# Patient Record
Sex: Female | Born: 1985 | Race: White | Hispanic: No | Marital: Married | Smoking: Never smoker
Health system: Southern US, Community
[De-identification: ages and names within clinical notes are randomized; demographics above are authoritative.]

## PROBLEM LIST (undated history)

## (undated) DIAGNOSIS — Z789 Other specified health status: Secondary | ICD-10-CM

## (undated) HISTORY — PX: WISDOM TOOTH EXTRACTION: SHX21

## (undated) HISTORY — PX: TONSILLECTOMY: SUR1361

---
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
## 2012-12-12 LAB — OB RESULTS CONSOLE RPR: RPR: NONREACTIVE

## 2012-12-12 LAB — OB RESULTS CONSOLE HIV ANTIBODY (ROUTINE TESTING): HIV: NONREACTIVE

## 2012-12-20 LAB — OB RESULTS CONSOLE RPR: RPR: NONREACTIVE

## 2012-12-20 LAB — OB RESULTS CONSOLE HEPATITIS B SURFACE ANTIGEN: Hepatitis B Surface Ag: NEGATIVE

## 2013-01-10 LAB — OB RESULTS CONSOLE RUBELLA ANTIBODY, IGM: Rubella: IMMUNE

## 2013-01-10 LAB — OB RESULTS CONSOLE ABO/RH: RH Type: POSITIVE

## 2013-07-09 ENCOUNTER — Telehealth (HOSPITAL_COMMUNITY): Payer: Self-pay | Admitting: *Deleted

## 2013-07-09 NOTE — Telephone Encounter (Signed)
Preadmission screen  

## 2013-07-11 ENCOUNTER — Inpatient Hospital Stay (HOSPITAL_COMMUNITY)
Admission: AD | Admit: 2013-07-11 | Discharge: 2013-07-11 | Disposition: A | Discharge status: Home / Self Care | Payer: 59 | Source: Ambulatory Visit | Attending: Obstetrics and Gynecology | Admitting: Obstetrics and Gynecology

## 2013-07-11 ENCOUNTER — Encounter (HOSPITAL_COMMUNITY): Payer: Self-pay | Admitting: *Deleted

## 2013-07-11 DIAGNOSIS — O480 Post-term pregnancy: Secondary | ICD-10-CM | POA: Insufficient documentation

## 2013-07-11 HISTORY — DX: Other specified health status: Z78.9

## 2013-07-11 NOTE — MAU Provider Note (Signed)
History   27 yo G1P0 at 41 weeks presented for NST for post-dates.  Reports +FM, denies leaking or bleeding.  Scheduled for induction 07/18/13 at 7am.  Per chart, cervix has been 2 cm, 70%, vtx 0 station at visit last week, with membranes swept by Dr. Estanislado Pandy.  Does not have appt scheduled at office for this week.  Chief Complaint  Patient presents with  . Non-stress Test   HPI:  See above  OB History   Grav Para Term Preterm Abortions TAB SAB Ect Mult Living   1               Past Medical History  Diagnosis Date  . Medical history non-contributory     Past Surgical History  Procedure Laterality Date  . Wisdom tooth extraction      Family History  Problem Relation Age of Onset  . Diabetes Maternal Grandfather   . Diabetes Paternal Grandfather     History  Substance Use Topics  . Smoking status: Never Smoker   . Smokeless tobacco: Not on file  . Alcohol Use: No    Allergies: No Known Allergies  No prescriptions prior to admission    ROS:  +FM. Physical Exam   Blood pressure 124/79, pulse 94, temperature 97.5 F (36.4 C), temperature source Oral, resp. rate 18, height 5\' 7"  (1.702 m), weight 162 lb (73.483 kg).  Physical Exam Cervix very posterior, vtx -1/0 station, just able to reach anterior lip of cervix. Will assume still 2 cm, agree with 70%.  ED Course  IUP at 41 weeks NST only visit, due to office closed today.  Plan: Office will call her Monday to schedule ROB visit early this week (Mon/Tues), with NST. Keep plan for induction on Friday, 07/18/13, at 7am. Reviewed FKCs and s/s of labor.    Nigel Bridgeman CNM, [place] 07/11/2013 1:20 PM

## 2013-07-11 NOTE — MAU Note (Signed)
Pt is here for outpatient NST

## 2013-07-14 ENCOUNTER — Inpatient Hospital Stay (HOSPITAL_COMMUNITY): Payer: 59

## 2013-07-15 ENCOUNTER — Inpatient Hospital Stay (HOSPITAL_COMMUNITY)
Admission: AD | Admit: 2013-07-15 | Discharge: 2013-07-18 | DRG: 765 | Disposition: A | Discharge status: Home / Self Care | Payer: 59 | Source: Ambulatory Visit | Attending: Obstetrics and Gynecology | Admitting: Obstetrics and Gynecology

## 2013-07-15 ENCOUNTER — Encounter (HOSPITAL_COMMUNITY): Payer: Self-pay | Admitting: *Deleted

## 2013-07-15 DIAGNOSIS — O209 Hemorrhage in early pregnancy, unspecified: Secondary | ICD-10-CM | POA: Diagnosis not present

## 2013-07-15 DIAGNOSIS — O411090 Infection of amniotic sac and membranes, unspecified, unspecified trimester, not applicable or unspecified: Secondary | ICD-10-CM | POA: Diagnosis present

## 2013-07-15 DIAGNOSIS — O9903 Anemia complicating the puerperium: Secondary | ICD-10-CM | POA: Diagnosis not present

## 2013-07-15 DIAGNOSIS — Z98891 History of uterine scar from previous surgery: Secondary | ICD-10-CM

## 2013-07-15 DIAGNOSIS — IMO0001 Reserved for inherently not codable concepts without codable children: Secondary | ICD-10-CM

## 2013-07-15 DIAGNOSIS — O480 Post-term pregnancy: Secondary | ICD-10-CM | POA: Diagnosis present

## 2013-07-15 DIAGNOSIS — O324XX0 Maternal care for high head at term, not applicable or unspecified: Secondary | ICD-10-CM | POA: Diagnosis present

## 2013-07-15 DIAGNOSIS — IMO0002 Reserved for concepts with insufficient information to code with codable children: Secondary | ICD-10-CM | POA: Diagnosis not present

## 2013-07-15 DIAGNOSIS — D62 Acute posthemorrhagic anemia: Secondary | ICD-10-CM | POA: Diagnosis not present

## 2013-07-15 DIAGNOSIS — D649 Anemia, unspecified: Secondary | ICD-10-CM | POA: Diagnosis not present

## 2013-07-15 LAB — CBC
HCT: 34.8 % — ABNORMAL LOW (ref 36.0–46.0)
Hemoglobin: 12.1 g/dL (ref 12.0–15.0)
MCH: 32.1 pg (ref 26.0–34.0)
MCHC: 34.8 g/dL (ref 30.0–36.0)
MCV: 92.3 fL (ref 78.0–100.0)
RBC: 3.77 MIL/uL — ABNORMAL LOW (ref 3.87–5.11)

## 2013-07-15 MED ORDER — IBUPROFEN 600 MG PO TABS: 600.0000 mg | ORAL_TABLET | Freq: Four times a day (QID) | ORAL | Status: DC | PRN

## 2013-07-15 MED ORDER — ONDANSETRON HCL 4 MG/2ML IJ SOLN: 4.0000 mg | Freq: Four times a day (QID) | INTRAMUSCULAR | Status: DC | PRN

## 2013-07-15 MED ORDER — LACTATED RINGERS IV SOLN
INTRAVENOUS | Status: DC
  Administered 2013-07-16 (×4): via INTRAVENOUS

## 2013-07-15 MED ORDER — CITRIC ACID-SODIUM CITRATE 334-500 MG/5ML PO SOLN
30.0000 mL | ORAL | Status: DC | PRN
  Administered 2013-07-16: 30 mL via ORAL
  Filled 2013-07-15: qty 15

## 2013-07-15 MED ORDER — FLEET ENEMA 7-19 GM/118ML RE ENEM: 1.0000 | ENEMA | RECTAL | Status: DC | PRN

## 2013-07-15 MED ORDER — ACETAMINOPHEN 325 MG PO TABS
650.0000 mg | ORAL_TABLET | ORAL | Status: DC | PRN
  Administered 2013-07-16 (×3): 650 mg via ORAL
  Filled 2013-07-15 (×3): qty 2

## 2013-07-15 MED ORDER — BUTORPHANOL TARTRATE 1 MG/ML IJ SOLN: 1.0000 mg | INTRAMUSCULAR | Status: DC | PRN

## 2013-07-15 MED ORDER — OXYTOCIN 40 UNITS IN LACTATED RINGERS INFUSION - SIMPLE MED
62.5000 mL/h | INTRAVENOUS | Status: DC
  Filled 2013-07-15: qty 1000

## 2013-07-15 MED ORDER — OXYCODONE-ACETAMINOPHEN 5-325 MG PO TABS: 1.0000 | ORAL_TABLET | ORAL | Status: DC | PRN

## 2013-07-15 MED ORDER — LIDOCAINE HCL (PF) 1 % IJ SOLN
30.0000 mL | INTRAMUSCULAR | Status: DC | PRN
  Filled 2013-07-15: qty 30

## 2013-07-15 MED ORDER — LACTATED RINGERS IV SOLN
500.0000 mL | INTRAVENOUS | Status: DC | PRN
  Administered 2013-07-15: 500 mL via INTRAVENOUS
  Administered 2013-07-16: 200 mL via INTRAVENOUS
  Administered 2013-07-16: 500 mL via INTRAVENOUS

## 2013-07-15 MED ORDER — OXYTOCIN BOLUS FROM INFUSION: 500.0000 mL | INTRAVENOUS | Status: DC

## 2013-07-15 NOTE — MAU Note (Signed)
Labor

## 2013-07-15 NOTE — H&P (Signed)
Shelley Sawyer is a 27 y.o. female, 25 4/7 weeks, presenting for with UCs of increasing frequency and intensity since office visit today, with membranes swept.  NST was non-reactive at office, with BPP 8/8 and normal AFI.  Denies leaking, bleeding, or leaking of fluid--reports +FM.  Patient Active Problem List   Diagnosis Date Noted  . Active labor 07/15/2013  . 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 First trimester bleeding 07/15/2013    History of present pregnancy: Patient entered care at 15 3/7 weeks at [facility], in transfer from [place], [place] prenatal care.  Began care there at 6 5/7 weeks, with early pregnancy bleeding--US at that visit, with confirmation of EDC. EDC of 07/04/13 was established by LMP and congruent with 6 5/7 week [place].   Anatomy scan:  19  3/7 weeks, with normal findings and an anterior placenta, female. Additional [place] evaluations:  08/04/13--BPP 8/8, AFI 13.7.   Significant prenatal events: TDaP and flu vaccine 06/10/13.  Frequent nosebleeds, but largely resolved in last few weeks.  Had spotting at 33 6/7 weeks s/p IC.  Membranes swept 11/18, 11/25, and today.   Last evaluation:  112/2/14 by Dr. Estanislado Pandy, with NR NST, BPP 8/8 and normal fluid.  Cervix 3, 80%, vtx, 0 station.  History OB History   Grav Para Term Preterm Abortions TAB SAB Ect Mult Living   1              Past Medical History  Diagnosis Date  . Medical history non-contributory    Past Surgical History  Procedure Laterality Date  . Wisdom tooth extraction     Family History: family history includes Cancer in her maternal grandfather; Diabetes in her maternal grandfather and paternal grandfather.  Social History:  reports that she has never smoked. She has never used smokeless tobacco. She reports that she does not drink alcohol or use illicit drugs.  Husband, Franky Macho, is involved and supportive.  Patient is employed in client services.   Prenatal Transfer Tool  Maternal Diabetes: No Genetic  Screening: Normal Quad screen Maternal Ultrasounds/Referrals: Normal Fetal Ultrasounds or other Referrals:  None Maternal Substance Abuse:  No Significant Maternal Medications:  None Significant Maternal Lab Results:  Lab values include: Group B Strep negative Other Comments:  No HIV testing during pregnancy--Rapid HIV drawn on admission.  ROS:  Contractions, + FM  Dilation: 3 Effacement (%): 90 Station: 0 Exam by:: Weston,RN Blood pressure 129/79, pulse 90, temperature 98.1 F (36.7 C), temperature source Oral, resp. rate 18, height 5\' 7"  (1.702 m), weight 164 lb (74.39 kg), last menstrual period 09/27/2012, SpO2 100.00%.  Exam Physical Exam  Chest clear Heart RRR without murmur Abd gravid, NT Pelvic--deferred after initial exam in MAU. Ext WNL  FHR Initially Category 2, with minimal variability, but stable baseline and negative spontaneous CST.  Now with moderate variability, Category 1. UCs q 3-5 min, mild/moderate  Prenatal labs: ABO, Rh: O/Positive/-- (05/30 0000) Antibody:  Neg Rubella: Immune (05/30 0000) RPR: Nonreactive (05/09 0000)  HBsAg: Negative (05/09 0000)  HIV: No result documented in previous PN records, not done during pregnancy care at [facility] GBS: Negative (10/23 0000)  No cultures done during pregnancy--declined. Hgb 12.2 at [facility] in [place], 11.9 at 29 weeks Glucola WNL Quad screen WNL Pap WNL 09/2010.  Assessment/Plan: IUP at 41 4/7 weeks Early labor GBS negative Desires non-interventive labor as much as possible, but open to options for care.  Plan: Admit to Birthing Suite per consult with Dr. Su Hilt Routine [facility] orders  Plan IV hydration initially to address FHR variability, then may saline lock if status reassuring. Will plan continuous monitoring at present.   Nigel Bridgeman 07/15/2013, 10:05 PM

## 2013-07-16 ENCOUNTER — Inpatient Hospital Stay (HOSPITAL_COMMUNITY): Payer: 59 | Admitting: Anesthesiology

## 2013-07-16 ENCOUNTER — Encounter (HOSPITAL_COMMUNITY)
Admission: AD | Disposition: A | Discharge status: Home / Self Care | Payer: Self-pay | Source: Ambulatory Visit | Attending: Obstetrics and Gynecology

## 2013-07-16 ENCOUNTER — Encounter (HOSPITAL_COMMUNITY): Payer: Self-pay | Admitting: *Deleted

## 2013-07-16 ENCOUNTER — Encounter (HOSPITAL_COMMUNITY): Payer: 59 | Admitting: Anesthesiology

## 2013-07-16 DIAGNOSIS — Z98891 History of uterine scar from previous surgery: Secondary | ICD-10-CM

## 2013-07-16 LAB — ABO/RH: ABO/RH(D): O POS

## 2013-07-16 LAB — RPR: RPR Ser Ql: NONREACTIVE

## 2013-07-16 SURGERY — Surgical Case
Anesthesia: Epidural | Site: Abdomen

## 2013-07-16 MED ORDER — TETANUS-DIPHTH-ACELL PERTUSSIS 5-2.5-18.5 LF-MCG/0.5 IM SUSP: 0.5000 mL | Freq: Once | INTRAMUSCULAR | Status: DC

## 2013-07-16 MED ORDER — FERROUS SULFATE 325 (65 FE) MG PO TABS
325.0000 mg | ORAL_TABLET | Freq: Two times a day (BID) | ORAL | Status: DC
  Administered 2013-07-17 – 2013-07-18 (×3): 325 mg via ORAL
  Filled 2013-07-16 (×3): qty 1

## 2013-07-16 MED ORDER — IBUPROFEN 600 MG PO TABS: 600.0000 mg | ORAL_TABLET | Freq: Four times a day (QID) | ORAL | Status: DC | PRN

## 2013-07-16 MED ORDER — LIDOCAINE-EPINEPHRINE (PF) 2 %-1:200000 IJ SOLN
INTRAMUSCULAR | Status: AC
  Filled 2013-07-16: qty 20

## 2013-07-16 MED ORDER — OXYTOCIN 10 UNIT/ML IJ SOLN
INTRAMUSCULAR | Status: AC
  Filled 2013-07-16: qty 4

## 2013-07-16 MED ORDER — FENTANYL CITRATE 0.05 MG/ML IJ SOLN
INTRAMUSCULAR | Status: AC
  Filled 2013-07-16: qty 2

## 2013-07-16 MED ORDER — LACTATED RINGERS IV SOLN
INTRAVENOUS | Status: DC
  Administered 2013-07-16: via INTRAVENOUS

## 2013-07-16 MED ORDER — WITCH HAZEL-GLYCERIN EX PADS: 1.0000 "application " | MEDICATED_PAD | CUTANEOUS | Status: DC | PRN

## 2013-07-16 MED ORDER — SENNOSIDES-DOCUSATE SODIUM 8.6-50 MG PO TABS
2.0000 | ORAL_TABLET | ORAL | Status: DC
  Administered 2013-07-17 – 2013-07-18 (×2): 2 via ORAL
  Filled 2013-07-16 (×2): qty 2

## 2013-07-16 MED ORDER — FENTANYL CITRATE 0.05 MG/ML IJ SOLN: 25.0000 ug | INTRAMUSCULAR | Status: DC | PRN

## 2013-07-16 MED ORDER — SCOPOLAMINE 1 MG/3DAYS TD PT72
MEDICATED_PATCH | TRANSDERMAL | Status: AC
  Filled 2013-07-16: qty 1

## 2013-07-16 MED ORDER — OXYCODONE-ACETAMINOPHEN 5-325 MG PO TABS
1.0000 | ORAL_TABLET | ORAL | Status: DC | PRN
  Administered 2013-07-18: 1 via ORAL
  Filled 2013-07-16: qty 2
  Filled 2013-07-16: qty 1

## 2013-07-16 MED ORDER — NALOXONE HCL 0.4 MG/ML IJ SOLN: 0.4000 mg | INTRAMUSCULAR | Status: DC | PRN

## 2013-07-16 MED ORDER — MEASLES, MUMPS & RUBELLA VAC ~~LOC~~ INJ
0.5000 mL | INJECTION | Freq: Once | SUBCUTANEOUS | Status: DC
  Filled 2013-07-16: qty 0.5

## 2013-07-16 MED ORDER — LANOLIN HYDROUS EX OINT: 1.0000 "application " | TOPICAL_OINTMENT | CUTANEOUS | Status: DC | PRN

## 2013-07-16 MED ORDER — KETOROLAC TROMETHAMINE 30 MG/ML IJ SOLN
30.0000 mg | Freq: Four times a day (QID) | INTRAMUSCULAR | Status: AC | PRN
  Administered 2013-07-16: 30 mg via INTRAVENOUS

## 2013-07-16 MED ORDER — PHENYLEPHRINE 40 MCG/ML (10ML) SYRINGE FOR IV PUSH (FOR BLOOD PRESSURE SUPPORT): 80.0000 ug | PREFILLED_SYRINGE | INTRAVENOUS | Status: DC | PRN

## 2013-07-16 MED ORDER — DIPHENHYDRAMINE HCL 50 MG/ML IJ SOLN: 12.5000 mg | INTRAMUSCULAR | Status: DC | PRN

## 2013-07-16 MED ORDER — DIPHENHYDRAMINE HCL 25 MG PO CAPS: 25.0000 mg | ORAL_CAPSULE | Freq: Four times a day (QID) | ORAL | Status: DC | PRN

## 2013-07-16 MED ORDER — SODIUM BICARBONATE 8.4 % IV SOLN
INTRAVENOUS | Status: DC | PRN
  Administered 2013-07-16 (×4): 5 mL via EPIDURAL

## 2013-07-16 MED ORDER — DIPHENHYDRAMINE HCL 25 MG PO CAPS: 25.0000 mg | ORAL_CAPSULE | ORAL | Status: DC | PRN

## 2013-07-16 MED ORDER — EPHEDRINE 5 MG/ML INJ
10.0000 mg | INTRAVENOUS | Status: DC | PRN
  Filled 2013-07-16: qty 4

## 2013-07-16 MED ORDER — SODIUM CHLORIDE 0.9 % IJ SOLN: 3.0000 mL | INTRAMUSCULAR | Status: DC | PRN

## 2013-07-16 MED ORDER — EPHEDRINE 5 MG/ML INJ: 10.0000 mg | INTRAVENOUS | Status: DC | PRN

## 2013-07-16 MED ORDER — ACETAMINOPHEN 500 MG PO TABS
1000.0000 mg | ORAL_TABLET | Freq: Four times a day (QID) | ORAL | Status: AC
  Administered 2013-07-17 (×3): 1000 mg via ORAL
  Filled 2013-07-16 (×3): qty 2

## 2013-07-16 MED ORDER — OXYTOCIN 40 UNITS IN LACTATED RINGERS INFUSION - SIMPLE MED: 62.5000 mL/h | INTRAVENOUS | Status: AC

## 2013-07-16 MED ORDER — FENTANYL CITRATE 0.05 MG/ML IJ SOLN
INTRAMUSCULAR | Status: DC | PRN
  Administered 2013-07-16: 50 ug via INTRAVENOUS

## 2013-07-16 MED ORDER — MORPHINE SULFATE 0.5 MG/ML IJ SOLN
INTRAMUSCULAR | Status: AC
  Filled 2013-07-16: qty 10

## 2013-07-16 MED ORDER — PRENATAL MULTIVITAMIN CH
1.0000 | ORAL_TABLET | Freq: Every day | ORAL | Status: DC
  Administered 2013-07-17 – 2013-07-18 (×2): 1 via ORAL
  Filled 2013-07-16 (×2): qty 1

## 2013-07-16 MED ORDER — NALBUPHINE HCL 10 MG/ML IJ SOLN
5.0000 mg | INTRAMUSCULAR | Status: DC | PRN
  Filled 2013-07-16: qty 1

## 2013-07-16 MED ORDER — MEPERIDINE HCL 25 MG/ML IJ SOLN: 6.2500 mg | INTRAMUSCULAR | Status: DC | PRN

## 2013-07-16 MED ORDER — ONDANSETRON HCL 4 MG PO TABS: 4.0000 mg | ORAL_TABLET | ORAL | Status: DC | PRN

## 2013-07-16 MED ORDER — ZOLPIDEM TARTRATE 5 MG PO TABS: 5.0000 mg | ORAL_TABLET | Freq: Every evening | ORAL | Status: DC | PRN

## 2013-07-16 MED ORDER — OXYTOCIN 10 UNIT/ML IJ SOLN
40.0000 [IU] | INTRAVENOUS | Status: DC | PRN
  Administered 2013-07-16: 40 [IU] via INTRAVENOUS

## 2013-07-16 MED ORDER — SIMETHICONE 80 MG PO CHEW: 80.0000 mg | CHEWABLE_TABLET | ORAL | Status: DC | PRN

## 2013-07-16 MED ORDER — FLEET ENEMA 7-19 GM/118ML RE ENEM: 1.0000 | ENEMA | Freq: Every day | RECTAL | Status: DC | PRN

## 2013-07-16 MED ORDER — MORPHINE SULFATE (PF) 0.5 MG/ML IJ SOLN
INTRAMUSCULAR | Status: DC | PRN
  Administered 2013-07-16: 4 mg via EPIDURAL

## 2013-07-16 MED ORDER — MIDAZOLAM HCL 2 MG/2ML IJ SOLN: 0.5000 mg | Freq: Once | INTRAMUSCULAR | Status: DC | PRN

## 2013-07-16 MED ORDER — IBUPROFEN 600 MG PO TABS
600.0000 mg | ORAL_TABLET | Freq: Four times a day (QID) | ORAL | Status: DC
  Administered 2013-07-16 – 2013-07-18 (×7): 600 mg via ORAL
  Filled 2013-07-16 (×8): qty 1

## 2013-07-16 MED ORDER — SIMETHICONE 80 MG PO CHEW
80.0000 mg | CHEWABLE_TABLET | ORAL | Status: DC
  Administered 2013-07-17 – 2013-07-18 (×2): 80 mg via ORAL
  Filled 2013-07-16 (×2): qty 1

## 2013-07-16 MED ORDER — ONDANSETRON HCL 4 MG/2ML IJ SOLN: 4.0000 mg | INTRAMUSCULAR | Status: DC | PRN

## 2013-07-16 MED ORDER — SODIUM BICARBONATE 8.4 % IV SOLN
INTRAVENOUS | Status: AC
  Filled 2013-07-16: qty 50

## 2013-07-16 MED ORDER — KETOROLAC TROMETHAMINE 30 MG/ML IJ SOLN
INTRAMUSCULAR | Status: AC
  Administered 2013-07-16: 30 mg via INTRAVENOUS
  Filled 2013-07-16: qty 1

## 2013-07-16 MED ORDER — CYCLOBENZAPRINE HCL 5 MG PO TABS
5.0000 mg | ORAL_TABLET | Freq: Three times a day (TID) | ORAL | Status: DC | PRN
  Filled 2013-07-16: qty 1

## 2013-07-16 MED ORDER — ONDANSETRON HCL 4 MG/2ML IJ SOLN: 4.0000 mg | Freq: Three times a day (TID) | INTRAMUSCULAR | Status: DC | PRN

## 2013-07-16 MED ORDER — SODIUM CHLORIDE 0.9 % IV SOLN
3.0000 g | INTRAVENOUS | Status: DC | PRN
  Administered 2013-07-16: 3 g via INTRAVENOUS

## 2013-07-16 MED ORDER — PHENYLEPHRINE 40 MCG/ML (10ML) SYRINGE FOR IV PUSH (FOR BLOOD PRESSURE SUPPORT)
80.0000 ug | PREFILLED_SYRINGE | INTRAVENOUS | Status: DC | PRN
  Filled 2013-07-16: qty 10

## 2013-07-16 MED ORDER — METOCLOPRAMIDE HCL 5 MG/ML IJ SOLN: 10.0000 mg | Freq: Three times a day (TID) | INTRAMUSCULAR | Status: DC | PRN

## 2013-07-16 MED ORDER — SODIUM CHLORIDE 0.9 % IV SOLN
3.0000 g | Freq: Four times a day (QID) | INTRAVENOUS | Status: DC
  Administered 2013-07-16 – 2013-07-17 (×6): 3 g via INTRAVENOUS
  Filled 2013-07-16 (×11): qty 3

## 2013-07-16 MED ORDER — MENTHOL 3 MG MT LOZG: 1.0000 | LOZENGE | OROMUCOSAL | Status: DC | PRN

## 2013-07-16 MED ORDER — ONDANSETRON HCL 4 MG/2ML IJ SOLN
INTRAMUSCULAR | Status: AC
  Filled 2013-07-16: qty 4

## 2013-07-16 MED ORDER — NALBUPHINE HCL 10 MG/ML IJ SOLN
5.0000 mg | INTRAMUSCULAR | Status: DC | PRN
Start: 2013-07-16 — End: 2013-07-18
  Filled 2013-07-16: qty 1

## 2013-07-16 MED ORDER — FENTANYL 2.5 MCG/ML BUPIVACAINE 1/10 % EPIDURAL INFUSION (WH - ANES)
14.0000 mL/h | INTRAMUSCULAR | Status: DC | PRN
  Administered 2013-07-16: 14 mL/h via EPIDURAL
  Filled 2013-07-16: qty 125

## 2013-07-16 MED ORDER — LACTATED RINGERS IV SOLN
500.0000 mL | Freq: Once | INTRAVENOUS | Status: AC
  Administered 2013-07-16: 500 mL via INTRAVENOUS

## 2013-07-16 MED ORDER — NALOXONE HCL 1 MG/ML IJ SOLN
1.0000 ug/kg/h | INTRAVENOUS | Status: DC | PRN
  Filled 2013-07-16: qty 2

## 2013-07-16 MED ORDER — BISACODYL 10 MG RE SUPP: 10.0000 mg | Freq: Every day | RECTAL | Status: DC | PRN

## 2013-07-16 MED ORDER — SCOPOLAMINE 1 MG/3DAYS TD PT72
1.0000 | MEDICATED_PATCH | Freq: Once | TRANSDERMAL | Status: DC
  Administered 2013-07-16: 1.5 mg via TRANSDERMAL

## 2013-07-16 MED ORDER — DIBUCAINE 1 % RE OINT: 1.0000 "application " | TOPICAL_OINTMENT | RECTAL | Status: DC | PRN

## 2013-07-16 MED ORDER — SIMETHICONE 80 MG PO CHEW
80.0000 mg | CHEWABLE_TABLET | Freq: Three times a day (TID) | ORAL | Status: DC
  Administered 2013-07-16 – 2013-07-18 (×5): 80 mg via ORAL
  Filled 2013-07-16 (×5): qty 1

## 2013-07-16 MED ORDER — KETOROLAC TROMETHAMINE 30 MG/ML IJ SOLN: 30.0000 mg | Freq: Four times a day (QID) | INTRAMUSCULAR | Status: AC | PRN

## 2013-07-16 MED ORDER — PROMETHAZINE HCL 25 MG/ML IJ SOLN: 6.2500 mg | INTRAMUSCULAR | Status: DC | PRN

## 2013-07-16 MED ORDER — DIPHENHYDRAMINE HCL 50 MG/ML IJ SOLN: 25.0000 mg | INTRAMUSCULAR | Status: DC | PRN

## 2013-07-16 SURGICAL SUPPLY — 37 items
BENZOIN TINCTURE PRP APPL 2/3 (GAUZE/BANDAGES/DRESSINGS) ×2 IMPLANT
CLAMP CORD UMBIL (MISCELLANEOUS) ×2 IMPLANT
CLOTH BEACON ORANGE TIMEOUT ST (SAFETY) ×2 IMPLANT
CONTAINER PREFILL 10% NBF 15ML (MISCELLANEOUS) IMPLANT
DRAIN JACKSON PRT FLT 10 (DRAIN) IMPLANT
DRAPE LG THREE QUARTER DISP (DRAPES) ×2 IMPLANT
DRSG OPSITE POSTOP 4X10 (GAUZE/BANDAGES/DRESSINGS) ×2 IMPLANT
DURAPREP 26ML APPLICATOR (WOUND CARE) ×2 IMPLANT
ELECT REM PT RETURN 9FT ADLT (ELECTROSURGICAL) ×2
ELECTRODE REM PT RTRN 9FT ADLT (ELECTROSURGICAL) ×1 IMPLANT
EVACUATOR SILICONE 100CC (DRAIN) IMPLANT
EXTRACTOR VACUUM M CUP 4 TUBE (SUCTIONS) IMPLANT
GLOVE BIO SURGEON STRL SZ 6.5 (GLOVE) ×2 IMPLANT
GLOVE BIOGEL PI IND STRL 7.0 (GLOVE) ×1 IMPLANT
GLOVE BIOGEL PI INDICATOR 7.0 (GLOVE) ×1
GOWN PREVENTION PLUS XLARGE (GOWN DISPOSABLE) ×2 IMPLANT
GOWN STRL REIN XL XLG (GOWN DISPOSABLE) ×2 IMPLANT
HEMOSTAT SURGICEL 4X8 (HEMOSTASIS) ×2 IMPLANT
KIT ABG SYR 3ML LUER SLIP (SYRINGE) ×4 IMPLANT
NEEDLE HYPO 25X5/8 SAFETYGLIDE (NEEDLE) ×4 IMPLANT
NS IRRIG 1000ML POUR BTL (IV SOLUTION) ×2 IMPLANT
PACK C SECTION WH (CUSTOM PROCEDURE TRAY) ×2 IMPLANT
PAD OB MATERNITY 4.3X12.25 (PERSONAL CARE ITEMS) ×2 IMPLANT
RTRCTR C-SECT PINK 25CM LRG (MISCELLANEOUS) IMPLANT
STAPLER VISISTAT 35W (STAPLE) IMPLANT
STRIP CLOSURE SKIN 1/2X4 (GAUZE/BANDAGES/DRESSINGS) ×2 IMPLANT
SUT CHROMIC 0 CT 1 (SUTURE) ×2 IMPLANT
SUT MNCRL AB 3-0 PS2 27 (SUTURE) ×2 IMPLANT
SUT PLAIN 2 0 (SUTURE)
SUT PLAIN 2 0 XLH (SUTURE) ×2 IMPLANT
SUT PLAIN ABS 2-0 CT1 27XMFL (SUTURE) IMPLANT
SUT SILK 2 0 SH (SUTURE) IMPLANT
SUT VIC AB 0 CTX 36 (SUTURE) ×4
SUT VIC AB 0 CTX36XBRD ANBCTRL (SUTURE) ×4 IMPLANT
TOWEL OR 17X24 6PK STRL BLUE (TOWEL DISPOSABLE) ×2 IMPLANT
TRAY FOLEY CATH 14FR (SET/KITS/TRAYS/PACK) ×2 IMPLANT
WATER STERILE IRR 1000ML POUR (IV SOLUTION) ×2 IMPLANT

## 2013-07-16 NOTE — Progress Notes (Signed)
Shelley Sawyer is a 27 y.o. G1P0 at [redacted]w[redacted]d by LMP admitted for active labor  Subjective:   Objective: BP 127/66  Pulse 132  Temp(Src) 101.7 F (38.7 C) (Axillary)  Resp 18  Ht 5\' 7"  (1.702 m)  Wt 164 lb (74.39 kg)  BMI 25.68 kg/m2  SpO2 99%  LMP 09/27/2012 I/O last 3 completed shifts: In: -  Out: 250 [Urine:250]    FHT:  FHR: 150-160 bpm, variability: moderate,  accelerations:  Present,  decelerations:  Present variables to 100s with good recovery UC:   regular, every 3 minutes SVE:   Dilation: 10 Effacement (%): 100 Station: +3 Exam by:: Dr. Normand Sloop  Labs: Lab Results  Component Value Date   WBC 14.0* 07/15/2013   HGB 12.1 07/15/2013   HCT 34.8* 07/15/2013   MCV 92.3 07/15/2013   PLT 179 07/15/2013    Assessment / Plan: Spontaneous labor, progressing normally  Labor: Progressing normally Preeclampsia:  no signs or symptoms of toxicity Fetal Wellbeing:  Category II Pain Control:  Epidural I/D:  n/a Anticipated MOD:  NSVD and continue abx for chorioamnionitis.  Monitor varibales closely while pushing  Tanny Harnack A 07/16/2013, 10:28 AM

## 2013-07-16 NOTE — Lactation Note (Signed)
This note was copied from the chart of Shelley Sawyer. Lactation Consultation Note  Patient Name: Shelley Willie Loy [id number] Date: 07/16/2013 Reason for consult: Initial assessment of this primipara and her newborn at 36 hours of age.  Baby is sound asleep and STS, is arousable but fussy and quickly resumes sleeping at breast.  Mom says her nurse has demonstrated hand expression and mom aware of benefits of STS and cue feedings.  She attended [facility] BF classes.  LC discussed normal newborn sleepiness and LC encouraged review of Baby and Me pp 14 and 20-25 for STS and BF information. LC provided [employer] brochure and reviewed [facility] services and list of community and web site resources.    Maternal Data Formula Feeding for Exclusion: No Infant to breast within first hour of birth: Yes (sleepy and did not latch) Has patient been taught Hand Expression?: Yes (mom reports being shown hand expression by her nurse; attended [facility] BF class) Does the patient have breastfeeding experience prior to this delivery?: No  Feeding Feeding Type: Breast Fed Length of feed: 0 min  LATCH Score/Interventions            None observed at this visit          Lactation Tools Discussed/Used   STS, hand expression, cue feedings  Consult Status Consult Status: Follow-up Date: 07/17/13 Follow-up type: In-patient    Warrick Parisian [facility] 07/16/2013, 10:32 PM

## 2013-07-16 NOTE — Op Note (Signed)
Cesarean Section Procedure Note   Shelley Sawyer  07/15/2013 - 07/16/2013  Indications: Dystocia and failed vacuum   Pre-operative Diagnosis: failed vacum.   Post-operative Diagnosis: Same   Surgeon: Surgeon(s) and Role:    * Michael Litter, MD - Primary   Assistants: none  Anesthesia: epidural   Procedure Details:  The patient was seen in the Holding Room. The risks, benefits, complications, treatment options, and expected outcomes were discussed with the patient. The patient concurred with the proposed plan, giving informed consent. identified as Shelley Sawyer and the procedure verified as C-Section Delivery. A Time Out was held and the above information confirmed.  After induction of anesthesia, the patient was draped and prepped in the usual sterile manner. A transverse incision was made and carried down through the subcutaneous tissue to the fascia. Fascial incision was made in the midline and extended transversely. The fascia was separated from the underlying rectus muscle superiorly and inferiorly. The peritoneum was identified and entered. Peritoneal incision was extended longitudinally with good visualization of bowel and bladder. The utero-vesical peritoneal reflection was incised transversely and the bladder flap was bluntly freed from the lower uterine segment.  An alexsis retractor was placed in the abdomen.   A low transverse uterine incision was made. The head was delivered to the incision.  It was difficult to deliver the head out of the incision.  A vacuum was used with two pulls and two pop offs.  All pulls in the green zone.   Delivered from cephalic presentation was a  infant, with Apgar scores of 8 at one minute and 9 at five minutes. Cord ph was sent the umbilical cord was clamped and cut cord blood was obtained for evaluation. The placenta was removed Intact and appeared normal. The uterine outline, tubes and ovaries appeared normal}. There was some adherant membranes which  were removed using the banjo curette.  A hand was placed on the uterine fundus while curetting to make sure there were no perforations that would go unnoticed.   The uterine incision was closed with running locked sutures of 0Vicryl. A second layer 0 vicrlyl was used to imbricate the uterine incision.  Surgicel was placed along the suture line on the left side of the uterus where there was minimal oozing noted.     Hemostasis was observed. Lavage was carried out until clear. The alexsis was removed.  The peritoneum was closed with 0 chromic.  The muscles were examined and any bleeders were made hemostatic using bovie cautery device. sugicel was placed in the LLQ over the muscle where some oozing was seen.    The fascia was then reapproximated with running sutures of 0 vicryl.  The subcutaneous tissue was reapproximated  With interrupted stitches using 2-0 plain gut. The subcuticular closure was performed using 3-76monocryl     Instrument, sponge, and needle counts were correct prior the abdominal closure and were correct at the conclusion of the case.    Findings: infant was delivered from vtx presentation. The fluid was clear.  The uterus tubes and ovaries appeared normal.      Estimated Blood Loss:  Total IV Fluids:   Urine Output: 1350CC OF rose colored urine  Specimens: placenta to pathology  Complications: no complications  Disposition: PACU - hemodynamically stable.   Maternal Condition: stable   Baby condition / location:  Couplet care / Skin to Skin  Attending Attestation: I was present and scrubbed for the entire procedure.   Signed: Surgeon(s):  Michael Litter, MD

## 2013-07-16 NOTE — Transfer of Care (Signed)
Immediate Anesthesia Transfer of Care Note  Patient: Shelley Sawyer  Procedure(s) Performed: Procedure(s): CESAREAN SECTION (N/A)  Patient Location: PACU  Anesthesia Type:Epidural  Level of Consciousness: awake, alert  and oriented  Airway & Oxygen Therapy: Patient Spontanous Breathing  Post-op Assessment: Report given to PACU RN and Post -op Vital signs reviewed and stable  Post vital signs: stable  Complications: No apparent anesthesia complications

## 2013-07-16 NOTE — Progress Notes (Addendum)
  Subjective: Had several gushes of fluid--now leaking small amount.  UCs same at present.  Objective: BP 110/59  Pulse 101  Temp(Src) 99.6 F (37.6 C) (Oral)  Resp 18  Ht 5\' 7"  (1.702 m)  Wt 164 lb (74.39 kg)  BMI 25.68 kg/m2  SpO2 100%  LMP 09/27/2012      FHT:  Category 1 UC:   regular, every 3-5 minutes SVE:   Deferred at present. Leaking clear fluid, small amount bloody show.  Assessment / Plan: Early labor SROM at 12:33am Will continue to observe for advancement of labor status.  Nigel Bridgeman 07/16/2013, 12:45a

## 2013-07-16 NOTE — Progress Notes (Signed)
  Subjective: Comfortable with epidural.  Objective: BP 99/42  Pulse 110  Temp(Src) 99.5 F (37.5 C) (Axillary)  Resp 18  Ht 5\' 7"  (1.702 m)  Wt 164 lb (74.39 kg)  BMI 25.68 kg/m2  SpO2 99%  LMP 09/27/2012   Received Unasyn 3 gm IV at 4;23am.    FHT:  Category 2, but reassuring.  Cycle of decreased variability, but moderate at present.  Baseline 155-165. UC:   regular, every 3 minutes SVE:   Dilation: 8 Effacement (%): 100 Station: 0;+1 Exam by:: V Lathma CNm  Assessment / Plan: Progressive labor Chorioamnionitis--temp improved after IV ATB Will continue to observe.  Nigel Bridgeman 07/16/2013, 6:31 AM

## 2013-07-16 NOTE — Anesthesia Preprocedure Evaluation (Signed)

## 2013-07-16 NOTE — Progress Notes (Signed)
Patient ID: Shelley Sawyer, female   DOB: [birth year], 27 y.o.   MRN: [id number] Pt complete and pushing. After 3 hours she was offered a CS VS A VAVD Complete plus 2 station LOA PT chose VAVD. Pt and family understood the risks to be but not limited to bleeding, scalp abrasion, cephlohematoma, intraventricular hemorrhage.  The family discussed this for about ten minutes then decided to proceed   There were 4 pulls. All in the green zone.   One pop off Pt went from plus 2 to plus 3 station.  Total time vacuum used 14 minutes.  Suction released between pulls.  Because of the chorioamnionitis and fetal tachycarda we will proceed with cesarean section

## 2013-07-16 NOTE — Progress Notes (Signed)
  Subjective: Walking and using birthing ball at intervals.  Alternating between being cold and hot.  Denies any recent viral exposures, previous fever, dysuria, or any other sx.  Objective: BP 129/79  Pulse 90  Temp(Src) 100.5 F (38.1 C) (Oral)  Resp 18  Ht 5\' 7"  (1.702 m)  Wt 164 lb (74.39 kg)  BMI 25.68 kg/m2  SpO2 100%  LMP 09/27/2012     FHT: Category 1 UC:   irregular, every 2-4 minutes SVE:   Dilation: 3 Effacement (%): 80 Station: 0 Exam by:: Chip Boer CNM Cervix very posterior and deviated to left--can barely reach the anterior portion of cervix.  Assessment / Plan: Post-dates pregnancy Early/latent phase labor GBS negative Maternal temp  Plan: Tylenol now IV bolus Recheck temp and cervix in 2 hours. Reviewed status with patient and family--if no cervical change or if temp continues/increases, I will recommend pitocin augmentation.   Christophr Calix 07/16/2013, 12:14 AM

## 2013-07-16 NOTE — Interval H&P Note (Signed)
History and Physical Interval Note:  07/16/2013 1:23 PM  Shelley Sawyer  has presented today for surgery, with the diagnosis of * No pre-op diagnosis entered *  The various methods of treatment have been discussed with the patient and family. After consideration of risks, benefits and other options for treatment, the patient has consented to  Procedure(s): CESAREAN SECTION (N/A) as a surgical intervention .  The patient's history has been reviewed, patient examined, no change in status, stable for surgery.  I have reviewed the patient's chart and labs.  Questions were answered to the patient's satisfaction.  Original H&P done by Nigel Bridgeman   [facility]

## 2013-07-16 NOTE — Progress Notes (Signed)
Pt having elevated temp and FHR tracing baseline changes, MD notifed of temp, FHR changes, SVE, and interventions provided. No new orders at this time, provided stated she is on the way.

## 2013-07-16 NOTE — Anesthesia Procedure Notes (Signed)

## 2013-07-16 NOTE — Progress Notes (Signed)
  Subjective: Much more uncomfortable with UCs--utilizing breathing and relaxation, family supportive.  Objective: BP 120/51  Pulse 126  Temp(Src) 100.3 F (37.9 C) (Oral)  Resp 18  Ht 5\' 7"  (1.702 m)  Wt 164 lb (74.39 kg)  BMI 25.68 kg/m2  SpO2 100%  LMP 09/27/2012      FHT:  Category 2--sporadic mild variables with UCs, occasional 10-12 beat accels. UC:   regular, every 2-3 minutes SVE:   Dilation: 5 Effacement (%): 80 Station: 0 Exam by:: Optometrist check patient due to cervix still being very posterior and deviated to left. Leaking clear fluid  Assessment / Plan: Progressive labor Chorioamnionitis GBS negative  Plan: Consulted with Dr. Su Hilt Unasyn 3 gm q 6 hours. IV hydration Tylenol prn temp > 100.4. Close observation of labor progress and FHR status. If no progress in next 2 hours, will augment.  Shelley Sawyer 07/16/2013, 3:57 AM

## 2013-07-16 NOTE — Anesthesia Postprocedure Evaluation (Signed)
Anesthesia Post Note  Patient: Shelley Sawyer  Procedure(s) Performed: Procedure(s) (LRB): CESAREAN SECTION (N/A)  Anesthesia type: Epidural  Patient location: PACU  Post pain: Pain level controlled  Post assessment: Post-op Vital signs reviewed  Last Vitals:  Filed Vitals:   07/16/13 1444  BP: 103/62  Pulse: 101  Temp: 36.7 C  Resp: 18    Post vital signs: Reviewed  Level of consciousness: awake  Complications: No apparent anesthesia complications

## 2013-07-16 NOTE — Consult Note (Addendum)
Neonatology Note:   Attendance at C-section:    I was asked by Dr. Normand Sloop to attend this primary C/S at 41 5/7 weeks due to failure to descend and failed vacuum delivery. The mother is a G1P0 O pos, GBS neg with infertility (conceived on Clomid). ROM 12 hours prior to delivery, fluid clear. She had a temp as high as 101.7 degrees during labor and she received Unasyn > 4 hours before delivery. Vacuum-assisted delivery. Infant vigorous with good spontaneous cry and tone. Needed only minimal bulb suctioning. Ap 8/9. Lungs clear to ausc in DR. To CN to care of Pediatrician.   Doretha Sou, MD

## 2013-07-17 LAB — CBC
MCH: 32.4 pg (ref 26.0–34.0)
MCHC: 35.2 g/dL (ref 30.0–36.0)
Platelets: 134 10*3/uL — ABNORMAL LOW (ref 150–400)
RBC: 3.06 MIL/uL — ABNORMAL LOW (ref 3.87–5.11)

## 2013-07-17 NOTE — Anesthesia Postprocedure Evaluation (Signed)
  Anesthesia Post-op Note  Patient: Shelley Sawyer  Procedure(s) Performed: Procedure(s): CESAREAN SECTION (N/A)  Patient Location: Mother/Baby  Anesthesia Type:Epidural  Level of Consciousness: awake, alert , oriented and patient cooperative  Airway and Oxygen Therapy: Patient Spontanous Breathing  Post-op Pain: mild  Post-op Assessment: Patient's Cardiovascular Status Stable, Respiratory Function Stable, No headache, No backache, No residual numbness and No residual motor weakness  Post-op Vital Signs: stable  Complications: No apparent anesthesia complications

## 2013-07-17 NOTE — Progress Notes (Signed)
Subjective:  Postpartum Day 1: Cesarean Delivery Patient reports tolerating PO.    Objective: Vital signs in last 24 hours: Temp:  [97.3 F (36.3 C)-98.2 F (36.8 C)] 97.4 F (36.3 C) (12/04 1330) Pulse Rate:  [67-97] 67 (12/04 1330) Resp:  [18-20] 18 (12/04 1330) BP: (92-109)/(51-73) 100/63 mmHg (12/04 1330) SpO2:  [94 %-98 %] 98 % (12/04 1330)  Physical Exam:  General: no distress Lochia: appropriate Uterine Fundus: firm Incision: healing well DVT Evaluation: No evidence of DVT seen on physical exam.   Recent Labs  07/15/13 2140 07/17/13 0555  HGB 12.1 9.9*  HCT 34.8* 28.1*    Assessment/Plan: Status post Cesarean section. Doing well postoperatively.  C and F for contraception Breast feeding Anemia discussed Possible discharge tomorrow.  Berley Gambrell V 07/17/2013, 5:19 PM

## 2013-07-18 ENCOUNTER — Encounter (HOSPITAL_COMMUNITY): Payer: Self-pay | Admitting: Obstetrics and Gynecology

## 2013-07-18 ENCOUNTER — Inpatient Hospital Stay (HOSPITAL_COMMUNITY): Admission: RE | Admit: 2013-07-18 | Payer: 59 | Source: Ambulatory Visit

## 2013-07-18 MED ORDER — IBUPROFEN 600 MG PO TABS: 600.0000 mg | ORAL_TABLET | Freq: Four times a day (QID) | ORAL | Status: DC

## 2013-07-18 MED ORDER — OXYCODONE-ACETAMINOPHEN 5-325 MG PO TABS: 1.0000 | ORAL_TABLET | ORAL | Status: DC | PRN

## 2013-07-18 NOTE — Discharge Summary (Signed)
Cesarean Section Delivery Discharge Summary  Shelley Sawyer  DOB:    February 26, 1986 MRN:    [id number] CSN:    [id number]  Date of admission:                  07/15/13  Date of discharge:                   07/18/13  Procedures this admission: primary LTCS for failed vacuum by Dr Normand Sloop   Date of Delivery: 07/16/13  Newborn Data:  Live born female  Birth Weight: 7 lb 13.9 oz (3570 g) APGAR: 8, 9  Home with mother.    History of Present Illness:  Ms. Shelley Sawyer is a 27 y.o. female, G1P1001, who presents at [redacted]w[redacted]d weeks gestation. The patient has been followed at the [facility] and Gynecology division of [facility].    Her pregnancy has been complicated by:  Patient Active Problem List   Diagnosis Date Noted  . Status post primary low transverse cesarean section 07/16/2013  . Prolonged second stage 07/16/2013   ultrasound.  Hospital course:  The patient was admitted for labor.   She received an epidural, and pitocin augmentation. She progressed to fully dilated and pushed for 3 hours, a vacuum extraction was attempted. Pt then had a LTCS by Dr Normand Sloop, without complications. Her postpartum course was complicated by mild anemia. Otherwise was routine.  She was discharged to home on postpartum day 2 doing well.  Feeding:  breast  Contraception:  condoms  Discharge hemoglobin:  Hemoglobin  Date Value Range Status  07/17/2013 9.9* 12.0 - 15.0 g/dL Final     REPEATED TO VERIFY     DELTA CHECK NOTED     HCT  Date Value Range Status  07/17/2013 28.1* 36.0 - 46.0 % Final    Discharge Physical Exam:   General: alert and no distress Lochia: appropriate Uterine Fundus: firm Incision: healing well, honeycomb dressing intact DVT Evaluation: No evidence of DVT seen on physical exam. Negative Homan's sign. No significant calf/ankle edema.  Intrapartum Procedures: vacuum and cesarean: low cervical, transverse Postpartum Procedures:  none Complications-Operative and Postpartum: none  Discharge Diagnoses: Term Pregnancy-delivered, anemia  Discharge Information:  Activity:           pelvic rest Diet:                routine and iron rich Medications: PNV, Ibuprofen, Iron and Percocet Condition:      stable Instructions:  Care After Cesarean Delivery  Refer to this sheet in the next few weeks. These instructions provide you with information on caring for yourself after your procedure. Your caregiver may also give you specific instructions. Your treatment has been planned according to current medical practices, but problems sometimes occur. Call your caregiver if you have any problems or questions after you go home. HOME CARE INSTRUCTIONS  Only take over-the-counter or prescription medicines as directed by your caregiver.  Do not drink alcohol, especially if you are breastfeeding or taking medicine to relieve pain.  Do not chew or smoke tobacco.  Continue to use good perineal care. Good perineal care includes:  Wiping your perineum from front to back.  Keeping your perineum clean.  Check your cut (incision) daily for increased redness, drainage, swelling, or separation of skin.  Clean your incision gently with soap and water every day, and then pat it dry. If your caregiver says it is okay, leave the incision uncovered. Use a bandage (  dressing) if the incision is draining fluid or appears irritated. If the adhesive strips across the incision do not fall off within 7 days, carefully peel them off.  Hug a pillow when coughing or sneezing until your incision is healed. This helps to relieve pain.  Do not use tampons or douche until your caregiver says it is okay.  Shower, wash your hair, and take tub baths as directed by your caregiver.  Wear a well-fitting bra that provides breast support.  Limit wearing support panties or control-top hose.  Drink enough fluids to keep your urine clear or pale yellow.  Eat  high-fiber foods such as whole grain cereals and breads, brown rice, beans, and fresh fruits and vegetables every day. These foods may help prevent or relieve constipation.  Resume activities such as climbing stairs, driving, lifting, exercising, or traveling as directed by your caregiver.  Talk to your caregiver about resuming sexual activities. This is dependent upon your risk of infection, your rate of healing, and your comfort and desire to resume sexual activity.  Try to have someone help you with your household activities and your newborn for at least a few days after you leave the hospital.  Rest as much as possible. Try to rest or take a nap when your newborn is sleeping.  Increase your activities gradually.  Keep all of your scheduled postpartum appointments. It is very important to keep your scheduled follow-up appointments. At these appointments, your caregiver will be checking to make sure that you are healing physically and emotionally. SEEK MEDICAL CARE IF:   You are passing large clots from your vagina. Save any clots to show your caregiver.  You have a foul smelling discharge from your vagina.  You have trouble urinating.  You are urinating frequently.  You have pain when you urinate.  You have a change in your bowel movements.  You have increasing redness, pain, or swelling near your incision.  You have pus draining from your incision.  Your incision is separating.  You have painful, hard, or reddened breasts.  You have a severe headache.  You have blurred vision or see spots.  You feel sad or depressed.  You have thoughts of hurting yourself or your newborn.  You have questions about your care, the care of your newborn, or medicines.  You are dizzy or lightheaded.  You have a rash.  You have pain, redness, or swelling at the site of the removed intravenous access (IV) tube.  You have nausea or vomiting.  You stopped breastfeeding and have not had  a menstrual period within 12 weeks of stopping.  You are not breastfeeding and have not had a menstrual period within 12 weeks of delivery.  You have a fever. SEEK IMMEDIATE MEDICAL CARE IF:  You have persistent pain.  You have chest pain.  You have shortness of breath.  You faint.  You have leg pain.  You have stomach pain.  Your vaginal bleeding saturates 2 or more sanitary pads in 1 hour. MAKE SURE YOU:   Understand these instructions.  Will watch your condition.  Will get help right away if you are not doing well or get worse. Document Released: 04/22/2002 Document Revised: 04/24/2012 Document Reviewed: 03/27/2012 [facility] Patient Information 2014 [place], [place].   Postpartum Depression and Baby Blues  The postpartum period begins right after the birth of a baby. During this time, there is often a great amount of joy and excitement. It is also a time of considerable changes  in the life of the parent(s). Regardless of how many times a mother gives birth, each child brings new challenges and dynamics to the family. It is not unusual to have feelings of excitement accompanied by confusing shifts in moods, emotions, and thoughts. All mothers are at risk of developing postpartum depression or the "baby blues." These mood changes can occur right after giving birth, or they may occur many months after giving birth. The baby blues or postpartum depression can be mild or severe. Additionally, postpartum depression can resolve rather quickly, or it can be a long-term condition. CAUSES Elevated hormones and their rapid decline are thought to be a main cause of postpartum depression and the baby blues. There are a number of hormones that radically change during and after pregnancy. Estrogen and progesterone usually decrease immediately after delivering your baby. The level of thyroid hormone and various cortisol steroids also rapidly drop. Other factors that play a major role in these  changes include major life events and genetics.  RISK FACTORS If you have any of the following risks for the baby blues or postpartum depression, know what symptoms to watch out for during the postpartum period. Risk factors that may increase the likelihood of getting the baby blues or postpartum depression include:  Havinga personal or family history of depression.  Having depression while being pregnant.  Having premenstrual or oral contraceptive-associated mood issues.  Having exceptional life stress.  Having marital conflict.  Lacking a social support network.  Having a baby with special needs.  Having health problems such as diabetes. SYMPTOMS Baby blues symptoms include:  Brief fluctuations in mood, such as going from extreme happiness to sadness.  Decreased concentration.  Difficulty sleeping.  Crying spells, tearfulness.  Irritability.  Anxiety. Postpartum depression symptoms typically begin within the first month after giving birth. These symptoms include:  Difficulty sleeping or excessive sleepiness.  Marked weight loss.  Agitation.  Feelings of worthlessness.  Lack of interest in activity or food. Postpartum psychosis is a very concerning condition and can be dangerous. Fortunately, it is rare. Displaying any of the following symptoms is cause for immediate medical attention. Postpartum psychosis symptoms include:  Hallucinations and delusions.  Bizarre or disorganized behavior.  Confusion or disorientation. DIAGNOSIS  A diagnosis is made by an evaluation of your symptoms. There are no medical or lab tests that lead to a diagnosis, but there are various questionnaires that a caregiver may use to identify those with the baby blues, postpartum depression, or psychosis. Often times, a screening tool called the [place] Postnatal Depression Scale is used to diagnose depression in the postpartum period.  TREATMENT The baby blues usually goes away on its  own in 1 to 2 weeks. Social support is often all that is needed. You should be encouraged to get adequate sleep and rest. Occasionally, you may be given medicines to help you sleep.  Postpartum depression requires treatment as it can last several months or longer if it is not treated. Treatment may include individual or group therapy, medicine, or both to address any social, physiological, and psychological factors that may play a role in the depression. Regular exercise, a healthy diet, rest, and social support may also be strongly recommended.  Postpartum psychosis is more serious and needs treatment right away. Hospitalization is often needed. HOME CARE INSTRUCTIONS  Get as much rest as you can. Nap when the baby sleeps.  Exercise regularly. Some women find yoga and walking to be beneficial.  Eat a balanced and nourishing  diet.  Do little things that you enjoy. Have a cup of tea, take a bubble bath, read your favorite magazine, or listen to your favorite music.  Avoid alcohol.  Ask for help with household chores, cooking, grocery shopping, or running errands as needed. Do not try to do everything.  Talk to people close to you about how you are feeling. Get support from your partner, family members, friends, or other new moms.  Try to stay positive in how you think. Think about the things you are grateful for.  Do not spend a lot of time alone.  Only take medicine as directed by your caregiver.  Keep all your postpartum appointments.  Let your caregiver know if you have any concerns. SEEK MEDICAL CARE IF: You are having a reaction or problems with your medicine. SEEK IMMEDIATE MEDICAL CARE IF:  You have suicidal feelings.  You feel you may harm the baby or someone else. Document Released: 05/04/2004 Document Revised: 10/23/2011 Document Reviewed: 06/06/2011 [facility] Patient Information 2014 [place], [place].  Discharge to: home  Follow-up Information   Follow up with [facility] In 6 weeks.   Specialty:  Obstetrics and Gynecology   Contact information:   [address] [place] [address] [phone]       Malissa Hippo 07/18/2013

## 2013-07-20 DIAGNOSIS — D62 Acute posthemorrhagic anemia: Secondary | ICD-10-CM | POA: Diagnosis not present

## 2013-07-20 DIAGNOSIS — Z8742 Personal history of other diseases of the female genital tract: Secondary | ICD-10-CM | POA: Insufficient documentation

## 2013-07-20 DIAGNOSIS — O0900 Supervision of pregnancy with history of infertility, unspecified trimester: Secondary | ICD-10-CM | POA: Insufficient documentation

## 2013-08-04 ENCOUNTER — Ambulatory Visit (INDEPENDENT_AMBULATORY_CARE_PROVIDER_SITE_OTHER): Payer: 59 | Admitting: Family Medicine

## 2013-08-04 VITALS — BP 116/72 | HR 61 | Temp 98.0°F | Resp 16 | Ht 66.5 in | Wt 141.0 lb

## 2013-08-04 DIAGNOSIS — Z0000 Encounter for general adult medical examination without abnormal findings: Secondary | ICD-10-CM

## 2013-08-04 NOTE — Progress Notes (Signed)
[facility] [address], [place] [address] [phone]  Date:  08/04/2013   Name:  Shelley Sawyer   DOB:  [birth year]   MRN:  [id number]  PCP:  No primary provider on file.    Chief Complaint: Annual Exam   History of Present Illness:  Hollin Crewe is a 27 y.o. very pleasant female patient who presents with the following:  She is here today for a PE which she needs for her job at [employer] for [employer]  She just delivered via CS on the 5th. She has a healthy baby girl at home.  Overall she is very healthy as well, and is nursing her daughter successfully.   She has had her Tdap and flu shots already She is not aware of any other health problems She does exercise usually, does not smoke, drinks alcohol in moderation Her pap is UTD  Patient Active Problem List   Diagnosis Date Noted  . Acute blood loss anemia 07/20/2013  . 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 Clomid pregnancy 07/20/2013  . Status post primary low transverse cesarean section 07/16/2013  . Prolonged second stage 07/16/2013    Past Medical History  Diagnosis Date  . Medical history non-contributory     Past Surgical History  Procedure Laterality Date  . Wisdom tooth extraction    . Cesarean section N/A 07/16/2013    Procedure: CESAREAN SECTION;  Surgeon: Michael Litter, MD;  Location: [facility];  Service: Obstetrics;  Laterality: N/A;    History  Substance Use Topics  . Smoking status: Never Smoker   . Smokeless tobacco: Never Used  . Alcohol Use: No    Family History  Problem Relation Age of Onset  . Diabetes Maternal Grandfather   . Cancer Maternal Grandfather     leaukemia  . Diabetes Paternal Grandfather     No Known Allergies  Medication list has been reviewed and updated.  Current Outpatient Prescriptions on File Prior to Visit  Medication Sig Dispense Refill  . ibuprofen (ADVIL,MOTRIN) 600 MG tablet Take 1 tablet (600 mg total) by mouth  every 6 (six) hours.  30 tablet  0  . oxyCODONE-acetaminophen (PERCOCET/ROXICET) 5-325 MG per tablet Take 1-2 tablets by mouth every 4 (four) hours as needed for severe pain (moderate - severe pain).  30 tablet  0   No current facility-administered medications on file prior to visit.    Review of Systems:  As per HPI- otherwise negative.   Physical Examination: Filed Vitals:   08/04/13 1220  BP: 116/72  Pulse: 61  Temp: 98 F (36.7 C)  Resp: 16   Filed Vitals:   08/04/13 1220  Height: 5' 6.5" (1.689 m)  Weight: 141 lb (63.957 kg)   Body mass index is 22.42 kg/(m^2). Ideal Body Weight: Weight in (lb) to have BMI = 25: 156.9  GEN: WDWN, NAD, Non-toxic, A & O x 3, looks well HEENT: Atraumatic, Normocephalic. Neck supple. No masses, No LAD.  Bilateral TM wnl, oropharynx normal.  PEERL,EOMI.   Ears and Nose: No external deformity. CV: RRR, No M/G/R. No JVD. No thrill. No extra heart sounds. PULM: CTA B, no wheezes, crackles, rhonchi. No retractions. No resp. distress. No accessory muscle use. ABD: S, NT, ND, +BS. No rebound. No HSM. C/s incision in good repair and healing well EXTR: No c/c/e NEURO Normal gait.  PSYCH: Normally interactive. Conversant. Not depressed or anxious appearing.  Calm demeanor.    Assessment and Plan:  Physical exam  Completed her form for work.  No particular labs are required.   See patient instructions for more details.     Signed Abbe Amsterdam, MD

## 2013-08-04 NOTE — Patient Instructions (Signed)
Congratulations on your new baby girl!  Once you are through your post- partum and breastfeeding period you might consider having a CBC, thyroid, CMP and lipid panel.    Your blood pressure looks fine and your immunizations are up to date.    Take care and happy holidays!

## 2016-12-27 DIAGNOSIS — Z0000 Encounter for general adult medical examination without abnormal findings: Secondary | ICD-10-CM | POA: Diagnosis not present

## 2017-05-22 DIAGNOSIS — D225 Melanocytic nevi of trunk: Secondary | ICD-10-CM | POA: Diagnosis not present

## 2017-05-22 DIAGNOSIS — D2271 Melanocytic nevi of right lower limb, including hip: Secondary | ICD-10-CM | POA: Diagnosis not present

## 2017-05-22 DIAGNOSIS — D2272 Melanocytic nevi of left lower limb, including hip: Secondary | ICD-10-CM | POA: Diagnosis not present

## 2017-06-25 DIAGNOSIS — Z23 Encounter for immunization: Secondary | ICD-10-CM | POA: Diagnosis not present

## 2018-02-06 DIAGNOSIS — Z01419 Encounter for gynecological examination (general) (routine) without abnormal findings: Secondary | ICD-10-CM | POA: Diagnosis not present

## 2018-02-06 DIAGNOSIS — Z6821 Body mass index (BMI) 21.0-21.9, adult: Secondary | ICD-10-CM | POA: Diagnosis not present

## 2018-03-15 DIAGNOSIS — E282 Polycystic ovarian syndrome: Secondary | ICD-10-CM | POA: Diagnosis not present

## 2018-03-15 DIAGNOSIS — N978 Female infertility of other origin: Secondary | ICD-10-CM | POA: Diagnosis not present

## 2018-04-03 DIAGNOSIS — Z3141 Encounter for fertility testing: Secondary | ICD-10-CM | POA: Diagnosis not present

## 2018-04-03 DIAGNOSIS — Z319 Encounter for procreative management, unspecified: Secondary | ICD-10-CM | POA: Diagnosis not present

## 2018-04-03 DIAGNOSIS — N8500 Endometrial hyperplasia, unspecified: Secondary | ICD-10-CM | POA: Diagnosis not present

## 2018-05-04 DIAGNOSIS — Z3183 Encounter for assisted reproductive fertility procedure cycle: Secondary | ICD-10-CM | POA: Diagnosis not present

## 2018-05-04 DIAGNOSIS — E282 Polycystic ovarian syndrome: Secondary | ICD-10-CM | POA: Diagnosis not present

## 2018-05-04 DIAGNOSIS — N978 Female infertility of other origin: Secondary | ICD-10-CM | POA: Diagnosis not present

## 2018-05-07 DIAGNOSIS — N978 Female infertility of other origin: Secondary | ICD-10-CM | POA: Diagnosis not present

## 2018-05-07 DIAGNOSIS — Z3183 Encounter for assisted reproductive fertility procedure cycle: Secondary | ICD-10-CM | POA: Diagnosis not present

## 2018-05-07 DIAGNOSIS — E282 Polycystic ovarian syndrome: Secondary | ICD-10-CM | POA: Diagnosis not present

## 2018-06-18 DIAGNOSIS — Z3200 Encounter for pregnancy test, result unknown: Secondary | ICD-10-CM | POA: Diagnosis not present

## 2018-06-20 DIAGNOSIS — Z3200 Encounter for pregnancy test, result unknown: Secondary | ICD-10-CM | POA: Diagnosis not present

## 2018-06-24 DIAGNOSIS — Z3200 Encounter for pregnancy test, result unknown: Secondary | ICD-10-CM | POA: Diagnosis not present

## 2018-07-01 DIAGNOSIS — Z3200 Encounter for pregnancy test, result unknown: Secondary | ICD-10-CM | POA: Diagnosis not present

## 2018-10-24 LAB — OB RESULTS CONSOLE RPR: RPR: NONREACTIVE

## 2018-10-24 LAB — OB RESULTS CONSOLE HIV ANTIBODY (ROUTINE TESTING): HIV: NONREACTIVE

## 2018-10-24 LAB — OB RESULTS CONSOLE RUBELLA ANTIBODY, IGM: Rubella: IMMUNE

## 2018-10-24 LAB — OB RESULTS CONSOLE HEPATITIS B SURFACE ANTIGEN: Hepatitis B Surface Ag: NEGATIVE

## 2019-04-16 ENCOUNTER — Other Ambulatory Visit: Payer: Self-pay | Admitting: Obstetrics and Gynecology

## 2019-04-29 ENCOUNTER — Telehealth (HOSPITAL_COMMUNITY): Payer: Self-pay | Admitting: *Deleted

## 2019-04-29 NOTE — Patient Instructions (Signed)
Shelley Sawyer  04/29/2019   Your procedure is scheduled on:  05/08/2019  Arrive at 0600 at Entrance C on [address] at [facility]  and [employer] [employer] [address]. You are invited to use the FREE valet parking or use the Visitor's parking deck.  Pick up the phone at the desk and dial [phone].  Call this number if you have problems the morning of surgery: [phone]  Remember:   Do not eat food:(After Midnight) Desps de medianoche.  Do not drink clear liquids: (After Midnight) Desps de medianoche.  Take these medicines the morning of surgery with A SIP OF WATER:  none   Do not wear jewelry, make-up or nail polish.  Do not wear lotions, powders, or perfumes. Do not wear deodorant.  Do not shave 48 hours prior to surgery.  Do not bring valuables to the hospital.  [facility] is not   responsible for any belongings or valuables brought to the hospital.  Contacts, dentures or bridgework may not be worn into surgery.  Leave suitcase in the car. After surgery it may be brought to your room.  For patients admitted to the hospital, checkout time is 11:00 AM the day of              discharge.      Please read over the following fact sheets that you were given:     Preparing for Surgery

## 2019-04-29 NOTE — Telephone Encounter (Signed)
Preadmission screen  

## 2019-05-01 ENCOUNTER — Telehealth (HOSPITAL_COMMUNITY): Payer: Self-pay | Admitting: *Deleted

## 2019-05-01 NOTE — Telephone Encounter (Signed)
Preadmission screen  

## 2019-05-02 ENCOUNTER — Encounter (HOSPITAL_COMMUNITY): Payer: Self-pay | Admitting: *Deleted

## 2019-05-05 NOTE — Patient Instructions (Signed)
Shelley Sawyer  05/05/2019   Your procedure is scheduled on:  05/08/2019  Arrive at 0600 at Entrance C on [address] at [facility]  and [employer]. You are invited to use the FREE valet parking or use the Visitor's parking deck.  Pick up the phone at the desk and dial [phone].  Call this number if you have problems the morning of surgery: [phone]  Remember:   Do not eat food:(After Midnight) Desps de medianoche.  Do not drink clear liquids: (After Midnight) Desps de medianoche.  Take these medicines the morning of surgery with A SIP OF WATER:  none   Do not wear jewelry, make-up or nail polish.  Do not wear lotions, powders, or perfumes. Do not wear deodorant.  Do not shave 48 hours prior to surgery.  Do not bring valuables to the hospital.  [facility] is not   responsible for any belongings or valuables brought to the hospital.  Contacts, dentures or bridgework may not be worn into surgery.  Leave suitcase in the car. After surgery it may be brought to your room.  For patients admitted to the hospital, checkout time is 11:00 AM the day of              discharge.      Please read over the following fact sheets that you were given:     Preparing for Surgery

## 2019-05-06 ENCOUNTER — Other Ambulatory Visit (HOSPITAL_COMMUNITY)
Admission: RE | Admit: 2019-05-06 | Discharge: 2019-05-06 | Disposition: A | Discharge status: Home / Self Care | Payer: 59 | Source: Ambulatory Visit | Attending: Obstetrics and Gynecology | Admitting: Obstetrics and Gynecology

## 2019-05-06 ENCOUNTER — Other Ambulatory Visit: Payer: Self-pay

## 2019-05-06 DIAGNOSIS — Z20828 Contact with and (suspected) exposure to other viral communicable diseases: Secondary | ICD-10-CM | POA: Insufficient documentation

## 2019-05-06 DIAGNOSIS — Z01812 Encounter for preprocedural laboratory examination: Secondary | ICD-10-CM | POA: Insufficient documentation

## 2019-05-06 LAB — CBC
HCT: 33.4 % — ABNORMAL LOW (ref 36.0–46.0)
Hemoglobin: 10.9 g/dL — ABNORMAL LOW (ref 12.0–15.0)
MCH: 27.9 pg (ref 26.0–34.0)
MCHC: 32.6 g/dL (ref 30.0–36.0)
MCV: 85.6 fL (ref 80.0–100.0)
Platelets: 180 10*3/uL (ref 150–400)
RBC: 3.9 MIL/uL (ref 3.87–5.11)
RDW: 13.3 % (ref 11.5–15.5)
WBC: 8 10*3/uL (ref 4.0–10.5)
nRBC: 0 % (ref 0.0–0.2)

## 2019-05-06 LAB — RPR: RPR Ser Ql: NONREACTIVE

## 2019-05-06 LAB — SARS CORONAVIRUS 2 (TAT 6-24 HRS): SARS Coronavirus 2: NEGATIVE

## 2019-05-06 NOTE — MAU Note (Signed)
Asymptomatic, swab collected. 

## 2019-05-08 ENCOUNTER — Inpatient Hospital Stay (HOSPITAL_COMMUNITY): Payer: 59 | Admitting: Anesthesiology

## 2019-05-08 ENCOUNTER — Encounter (HOSPITAL_COMMUNITY)
Admission: AD | Disposition: A | Discharge status: Home / Self Care | Payer: Self-pay | Source: Home / Self Care | Attending: Obstetrics and Gynecology

## 2019-05-08 ENCOUNTER — Inpatient Hospital Stay (HOSPITAL_COMMUNITY)
Admission: AD | Admit: 2019-05-08 | Discharge: 2019-05-10 | DRG: 788 | Disposition: A | Discharge status: Home / Self Care | Payer: 59 | Attending: Obstetrics and Gynecology | Admitting: Obstetrics and Gynecology

## 2019-05-08 ENCOUNTER — Other Ambulatory Visit: Payer: Self-pay

## 2019-05-08 ENCOUNTER — Encounter (HOSPITAL_COMMUNITY): Payer: Self-pay | Admitting: *Deleted

## 2019-05-08 DIAGNOSIS — Z98891 History of uterine scar from previous surgery: Secondary | ICD-10-CM

## 2019-05-08 DIAGNOSIS — Z3A38 38 weeks gestation of pregnancy: Secondary | ICD-10-CM | POA: Diagnosis not present

## 2019-05-08 DIAGNOSIS — O34211 Maternal care for low transverse scar from previous cesarean delivery: Principal | ICD-10-CM | POA: Diagnosis present

## 2019-05-08 DIAGNOSIS — O9902 Anemia complicating childbirth: Secondary | ICD-10-CM | POA: Diagnosis present

## 2019-05-08 DIAGNOSIS — O365930 Maternal care for other known or suspected poor fetal growth, third trimester, not applicable or unspecified: Secondary | ICD-10-CM | POA: Diagnosis present

## 2019-05-08 DIAGNOSIS — Z20828 Contact with and (suspected) exposure to other viral communicable diseases: Secondary | ICD-10-CM | POA: Diagnosis present

## 2019-05-08 DIAGNOSIS — D649 Anemia, unspecified: Secondary | ICD-10-CM | POA: Diagnosis present

## 2019-05-08 DIAGNOSIS — O34219 Maternal care for unspecified type scar from previous cesarean delivery: Secondary | ICD-10-CM | POA: Diagnosis present

## 2019-05-08 LAB — TYPE AND SCREEN
ABO/RH(D): O POS
Antibody Screen: NEGATIVE

## 2019-05-08 LAB — ABO/RH: ABO/RH(D): O POS

## 2019-05-08 SURGERY — Surgical Case
Anesthesia: Spinal

## 2019-05-08 MED ORDER — ONDANSETRON HCL 4 MG/2ML IJ SOLN
INTRAMUSCULAR | Status: AC
  Filled 2019-05-08: qty 2

## 2019-05-08 MED ORDER — SODIUM CHLORIDE 0.9 % IR SOLN
Status: DC | PRN
  Administered 2019-05-08: 1

## 2019-05-08 MED ORDER — SCOPOLAMINE 1 MG/3DAYS TD PT72
1.0000 | MEDICATED_PATCH | Freq: Once | TRANSDERMAL | Status: DC
  Administered 2019-05-08: 09:00:00 1.5 mg via TRANSDERMAL

## 2019-05-08 MED ORDER — SODIUM CHLORIDE 0.9 % IV SOLN
INTRAVENOUS | Status: DC | PRN
  Administered 2019-05-08: 40 [IU] via INTRAVENOUS

## 2019-05-08 MED ORDER — WITCH HAZEL-GLYCERIN EX PADS: 1.0000 "application " | MEDICATED_PAD | CUTANEOUS | Status: DC | PRN

## 2019-05-08 MED ORDER — BUPIVACAINE HCL (PF) 0.25 % IJ SOLN
INTRAMUSCULAR | Status: AC
  Filled 2019-05-08: qty 10

## 2019-05-08 MED ORDER — DIBUCAINE (PERIANAL) 1 % EX OINT: 1.0000 "application " | TOPICAL_OINTMENT | CUTANEOUS | Status: DC | PRN

## 2019-05-08 MED ORDER — TETANUS-DIPHTH-ACELL PERTUSSIS 5-2.5-18.5 LF-MCG/0.5 IM SUSP: 0.5000 mL | Freq: Once | INTRAMUSCULAR | Status: DC

## 2019-05-08 MED ORDER — SIMETHICONE 80 MG PO CHEW
80.0000 mg | CHEWABLE_TABLET | ORAL | Status: DC | PRN
  Administered 2019-05-08 – 2019-05-09 (×2): 80 mg via ORAL
  Filled 2019-05-08: qty 1

## 2019-05-08 MED ORDER — DIPHENHYDRAMINE HCL 50 MG/ML IJ SOLN: 12.5000 mg | INTRAMUSCULAR | Status: DC | PRN

## 2019-05-08 MED ORDER — NALOXONE HCL 4 MG/10ML IJ SOLN
1.0000 ug/kg/h | INTRAVENOUS | Status: DC | PRN
  Filled 2019-05-08: qty 5

## 2019-05-08 MED ORDER — KETOROLAC TROMETHAMINE 30 MG/ML IJ SOLN
30.0000 mg | Freq: Four times a day (QID) | INTRAMUSCULAR | Status: AC
  Administered 2019-05-09: 06:00:00 30 mg via INTRAVENOUS
  Filled 2019-05-08: qty 1

## 2019-05-08 MED ORDER — OXYCODONE-ACETAMINOPHEN 5-325 MG PO TABS
1.0000 | ORAL_TABLET | ORAL | Status: DC | PRN
  Administered 2019-05-09 – 2019-05-10 (×5): 1 via ORAL
  Filled 2019-05-08 (×6): qty 1

## 2019-05-08 MED ORDER — BUPIVACAINE IN DEXTROSE 0.75-8.25 % IT SOLN
INTRATHECAL | Status: DC | PRN
  Administered 2019-05-08: 1.6 mL via INTRATHECAL

## 2019-05-08 MED ORDER — SODIUM CHLORIDE 0.9 % IV SOLN
INTRAVENOUS | Status: DC | PRN
  Administered 2019-05-08: 60 ug/min via INTRAVENOUS

## 2019-05-08 MED ORDER — SCOPOLAMINE 1 MG/3DAYS TD PT72
MEDICATED_PATCH | TRANSDERMAL | Status: AC
  Filled 2019-05-08: qty 1

## 2019-05-08 MED ORDER — LACTATED RINGERS IV SOLN
INTRAVENOUS | Status: DC
  Administered 2019-05-08 (×2): via INTRAVENOUS

## 2019-05-08 MED ORDER — COCONUT OIL OIL: 1.0000 "application " | TOPICAL_OIL | Status: DC | PRN

## 2019-05-08 MED ORDER — PHENYLEPHRINE HCL-NACL 20-0.9 MG/250ML-% IV SOLN
INTRAVENOUS | Status: AC
  Filled 2019-05-08: qty 250

## 2019-05-08 MED ORDER — OXYTOCIN 40 UNITS IN NORMAL SALINE INFUSION - SIMPLE MED: 2.5000 [IU]/h | INTRAVENOUS | Status: AC

## 2019-05-08 MED ORDER — OXYCODONE HCL 5 MG PO TABS: 5.0000 mg | ORAL_TABLET | Freq: Once | ORAL | Status: DC | PRN

## 2019-05-08 MED ORDER — MENTHOL 3 MG MT LOZG: 1.0000 | LOZENGE | OROMUCOSAL | Status: DC | PRN

## 2019-05-08 MED ORDER — SIMETHICONE 80 MG PO CHEW
80.0000 mg | CHEWABLE_TABLET | ORAL | Status: DC
  Administered 2019-05-09 (×2): 80 mg via ORAL
  Filled 2019-05-08 (×2): qty 1

## 2019-05-08 MED ORDER — FENTANYL CITRATE (PF) 100 MCG/2ML IJ SOLN
INTRAMUSCULAR | Status: DC | PRN
  Administered 2019-05-08: 15 ug via INTRATHECAL

## 2019-05-08 MED ORDER — LACTATED RINGERS IV SOLN
INTRAVENOUS | Status: DC
  Administered 2019-05-08 (×2): via INTRAVENOUS

## 2019-05-08 MED ORDER — SIMETHICONE 80 MG PO CHEW
80.0000 mg | CHEWABLE_TABLET | Freq: Three times a day (TID) | ORAL | Status: DC
  Administered 2019-05-08 – 2019-05-10 (×4): 80 mg via ORAL
  Filled 2019-05-08 (×6): qty 1

## 2019-05-08 MED ORDER — SODIUM CHLORIDE 0.9% FLUSH: 3.0000 mL | INTRAVENOUS | Status: DC | PRN

## 2019-05-08 MED ORDER — SENNOSIDES-DOCUSATE SODIUM 8.6-50 MG PO TABS
2.0000 | ORAL_TABLET | ORAL | Status: DC
  Administered 2019-05-09 (×2): 2 via ORAL
  Filled 2019-05-08 (×2): qty 2

## 2019-05-08 MED ORDER — NALOXONE HCL 0.4 MG/ML IJ SOLN: 0.4000 mg | INTRAMUSCULAR | Status: DC | PRN

## 2019-05-08 MED ORDER — OXYCODONE HCL 5 MG/5ML PO SOLN: 5.0000 mg | Freq: Once | ORAL | Status: DC | PRN

## 2019-05-08 MED ORDER — NALBUPHINE HCL 10 MG/ML IJ SOLN
5.0000 mg | Freq: Once | INTRAMUSCULAR | Status: DC | PRN
  Filled 2019-05-08: qty 0.5

## 2019-05-08 MED ORDER — PRENATAL MULTIVITAMIN CH
1.0000 | ORAL_TABLET | Freq: Every day | ORAL | Status: DC
  Administered 2019-05-08 – 2019-05-09 (×2): 1 via ORAL
  Filled 2019-05-08 (×2): qty 1

## 2019-05-08 MED ORDER — CEFAZOLIN SODIUM-DEXTROSE 2-4 GM/100ML-% IV SOLN
INTRAVENOUS | Status: AC
  Filled 2019-05-08: qty 100

## 2019-05-08 MED ORDER — BUPIVACAINE HCL (PF) 0.25 % IJ SOLN
INTRAMUSCULAR | Status: DC | PRN
  Administered 2019-05-08: 10 mL

## 2019-05-08 MED ORDER — MORPHINE SULFATE (PF) 0.5 MG/ML IJ SOLN
INTRAMUSCULAR | Status: DC | PRN
  Administered 2019-05-08: .15 mg via INTRATHECAL

## 2019-05-08 MED ORDER — NALBUPHINE HCL 10 MG/ML IJ SOLN
5.0000 mg | INTRAMUSCULAR | Status: DC | PRN
  Filled 2019-05-08: qty 0.5

## 2019-05-08 MED ORDER — METHYLERGONOVINE MALEATE 0.2 MG PO TABS: 0.2000 mg | ORAL_TABLET | ORAL | Status: DC | PRN

## 2019-05-08 MED ORDER — MEPERIDINE HCL 25 MG/ML IJ SOLN: 6.2500 mg | INTRAMUSCULAR | Status: DC | PRN

## 2019-05-08 MED ORDER — SODIUM CHLORIDE 0.9 % IV SOLN
INTRAVENOUS | Status: DC | PRN
  Administered 2019-05-08: 08:00:00 via INTRAVENOUS

## 2019-05-08 MED ORDER — ONDANSETRON HCL 4 MG/2ML IJ SOLN
INTRAMUSCULAR | Status: DC | PRN
  Administered 2019-05-08: 4 mg via INTRAVENOUS

## 2019-05-08 MED ORDER — OXYTOCIN 40 UNITS IN NORMAL SALINE INFUSION - SIMPLE MED
INTRAVENOUS | Status: AC
  Filled 2019-05-08: qty 1000

## 2019-05-08 MED ORDER — CEFAZOLIN SODIUM-DEXTROSE 2-4 GM/100ML-% IV SOLN
2.0000 g | INTRAVENOUS | Status: AC
  Administered 2019-05-08: 2 g via INTRAVENOUS

## 2019-05-08 MED ORDER — DIPHENHYDRAMINE HCL 25 MG PO CAPS: 25.0000 mg | ORAL_CAPSULE | ORAL | Status: DC | PRN

## 2019-05-08 MED ORDER — IBUPROFEN 800 MG PO TABS
800.0000 mg | ORAL_TABLET | Freq: Four times a day (QID) | ORAL | Status: DC
  Administered 2019-05-08 – 2019-05-10 (×4): 800 mg via ORAL
  Filled 2019-05-08 (×6): qty 1

## 2019-05-08 MED ORDER — ONDANSETRON HCL 4 MG/2ML IJ SOLN: 4.0000 mg | Freq: Three times a day (TID) | INTRAMUSCULAR | Status: DC | PRN

## 2019-05-08 MED ORDER — METHYLERGONOVINE MALEATE 0.2 MG/ML IJ SOLN: 0.2000 mg | INTRAMUSCULAR | Status: DC | PRN

## 2019-05-08 MED ORDER — FENTANYL CITRATE (PF) 100 MCG/2ML IJ SOLN: 25.0000 ug | INTRAMUSCULAR | Status: DC | PRN

## 2019-05-08 MED ORDER — MORPHINE SULFATE (PF) 0.5 MG/ML IJ SOLN
INTRAMUSCULAR | Status: AC
  Filled 2019-05-08: qty 10

## 2019-05-08 MED ORDER — ZOLPIDEM TARTRATE 5 MG PO TABS: 5.0000 mg | ORAL_TABLET | Freq: Every evening | ORAL | Status: DC | PRN

## 2019-05-08 MED ORDER — FENTANYL CITRATE (PF) 100 MCG/2ML IJ SOLN
INTRAMUSCULAR | Status: AC
  Filled 2019-05-08: qty 2

## 2019-05-08 MED ORDER — DIPHENHYDRAMINE HCL 25 MG PO CAPS: 25.0000 mg | ORAL_CAPSULE | Freq: Four times a day (QID) | ORAL | Status: DC | PRN

## 2019-05-08 MED ORDER — ONDANSETRON HCL 4 MG/2ML IJ SOLN: 4.0000 mg | Freq: Once | INTRAMUSCULAR | Status: DC | PRN

## 2019-05-08 SURGICAL SUPPLY — 33 items
CHLORAPREP W/TINT 26ML (MISCELLANEOUS) ×2 IMPLANT
CLAMP CORD UMBIL (MISCELLANEOUS) IMPLANT
CLOTH BEACON ORANGE TIMEOUT ST (SAFETY) ×2 IMPLANT
DRSG OPSITE POSTOP 4X10 (GAUZE/BANDAGES/DRESSINGS) ×2 IMPLANT
ELECT REM PT RETURN 9FT ADLT (ELECTROSURGICAL) ×2
ELECTRODE REM PT RTRN 9FT ADLT (ELECTROSURGICAL) ×1 IMPLANT
EXTRACTOR VACUUM M CUP 4 TUBE (SUCTIONS) IMPLANT
GLOVE BIO SURGEON STRL SZ7.5 (GLOVE) ×2 IMPLANT
GLOVE BIOGEL PI IND STRL 7.0 (GLOVE) ×1 IMPLANT
GLOVE BIOGEL PI INDICATOR 7.0 (GLOVE) ×1
GOWN STRL REUS W/TWL LRG LVL3 (GOWN DISPOSABLE) ×4 IMPLANT
KIT ABG SYR 3ML LUER SLIP (SYRINGE) IMPLANT
NEEDLE HYPO 22GX1.5 SAFETY (NEEDLE) ×2 IMPLANT
NEEDLE HYPO 25X5/8 SAFETYGLIDE (NEEDLE) IMPLANT
NEEDLE SPNL 20GX3.5 QUINCKE YW (NEEDLE) IMPLANT
NS IRRIG 1000ML POUR BTL (IV SOLUTION) ×2 IMPLANT
PACK C SECTION WH (CUSTOM PROCEDURE TRAY) ×2 IMPLANT
PENCIL SMOKE EVAC W/HOLSTER (ELECTROSURGICAL) ×2 IMPLANT
SPONGE LAP 18X18 X RAY DECT (DISPOSABLE) ×2 IMPLANT
SUT MNCRL 0 VIOLET CTX 36 (SUTURE) ×4 IMPLANT
SUT MNCRL AB 3-0 PS2 27 (SUTURE) ×2 IMPLANT
SUT MON AB 2-0 CT1 27 (SUTURE) ×2 IMPLANT
SUT MON AB-0 CT1 36 (SUTURE) ×4 IMPLANT
SUT MONOCRYL 0 CTX 36 (SUTURE) ×4
SUT PLAIN 0 NONE (SUTURE) IMPLANT
SUT PLAIN 2 0 (SUTURE) ×1
SUT PLAIN 2 0 XLH (SUTURE) IMPLANT
SUT PLAIN ABS 2-0 CT1 27XMFL (SUTURE) ×1 IMPLANT
SYR 20CC LL (SYRINGE) IMPLANT
SYR CONTROL 10ML LL (SYRINGE) ×2 IMPLANT
TOWEL OR 17X24 6PK STRL BLUE (TOWEL DISPOSABLE) ×2 IMPLANT
TRAY FOLEY W/BAG SLVR 14FR LF (SET/KITS/TRAYS/PACK) ×2 IMPLANT
WATER STERILE IRR 1000ML POUR (IV SOLUTION) ×2 IMPLANT

## 2019-05-08 NOTE — Anesthesia Procedure Notes (Signed)
Spinal  Patient location during procedure: OR Staffing Anesthesiologist: Hassel Uphoff E, MD Performed: anesthesiologist  Preanesthetic Checklist Completed: patient identified, surgical consent, pre-op evaluation, timeout performed, IV checked, risks and benefits discussed and monitors and equipment checked Spinal Block Patient position: sitting Prep: site prepped and draped and DuraPrep Patient monitoring: continuous pulse ox, blood pressure and heart rate Approach: midline Location: L3-4 Injection technique: single-shot Needle Needle type: Pencan  Needle gauge: 24 G Needle length: 9 cm Additional Notes Functioning IV was confirmed and monitors were applied. Sterile prep and drape, including hand hygiene and sterile gloves were used. The patient was positioned and the spine was prepped. The skin was anesthetized with lidocaine.  Free flow of clear CSF was obtained prior to injecting local anesthetic into the CSF. The needle was carefully withdrawn. The patient tolerated the procedure well.      

## 2019-05-08 NOTE — Lactation Note (Signed)
This note was copied from a baby's chart. Lactation Consultation Note  Patient Name: Shelley Sawyer [id number] Date: 05/08/2019 Reason for consult: Initial assessment;Mother's request;Difficult latch;Early term 37-38.6wks  Called to mom's room by [facility], baby is ready to feed. Mom with concerns about using NS  Initial assessment of P2 mom, baby is 72 hours old, del by C/S @ 38.6wks  LC entered room to find mom in bed with baby in football hold position. Infant latched and audible swallows noted. Mom reports breast changes during pregnancy and breastfed her first child 5 years ago for 9 months. Mom reports needing a nipple shield at first when breastfeeding being established.  Mom with erect but short shaft nipples, rounded after infant became unlatched. Breasts soft and compressible.  Reviewed hand expression with mom, basic latch techniques, wide mouth and flanged lips of baby during feeding. Alerted mom to quiet but audible swallows from baby. Mom latched baby independently without using nipple shield. However mom wanted one just in case. Demonstrated application of nipple shield and fitted with #24 NS. Encouraged mom to try to latch infant without shield if possible. Reviewed breast massage prior to feeding and breast compression during feedings. Mom given lactation brochure and notified of IP/OP lactation services with phone number. Encouraged mom to perform as much STS as possible. Mom noted to be getting sleepy as feeding progressed and notified it could be due to release of hormones during breastfeeding. FOB at bedside to monitor mom and baby.  Maternal Data Has patient been taught Hand Expression?: Yes(reviewed) Does the patient have breastfeeding experience prior to this delivery?: Yes  Feeding Feeding Type: Breast Fed  LATCH Score Latch: Grasps breast easily, tongue down, lips flanged, rhythmical sucking.  Audible Swallowing: Spontaneous and intermittent  Type of Nipple:  Everted at rest and after stimulation  Comfort (Breast/Nipple): Soft / non-tender  Hold (Positioning): No assistance needed to correctly position infant at breast.  LATCH Score: 10  Interventions Interventions: Breast feeding basics reviewed;Skin to skin;Breast massage;Hand express;Position options;Expressed milk;Breast compression  Lactation Tools Discussed/Used Tools: Nipple Shields Nipple shield size: 24(infant latched without shield, fitted per mom's request)   Consult Status Consult Status: Follow-up Date: 05/09/19    Cranston Neighbor 05/08/2019, 3:08 PM

## 2019-05-08 NOTE — H&P (Signed)
Shelley Sawyer is a 33 y.o. female presenting for rpt csection for IUGR and elevated UAD. OB History    Gravida  4   Para  1   Term  1   Preterm      AB  2   Living  1     SAB  2   TAB      Ectopic      Multiple      Live Births  1          Past Medical History:  Diagnosis Date  . Medical history non-contributory    Past Surgical History:  Procedure Laterality Date  . CESAREAN SECTION N/A 07/16/2013   Procedure: CESAREAN SECTION;  Surgeon: Betsy Coder, MD;  Location: [facility];  Service: Obstetrics;  Laterality: N/A;  . TONSILLECTOMY    . WISDOM TOOTH EXTRACTION     Family History: family history includes Cancer in her maternal grandfather; Diabetes in her maternal grandfather and paternal grandfather. Social History:  reports that she has never smoked. She has never used smokeless tobacco. She reports that she does not drink alcohol or use drugs.     Maternal Diabetes: No Genetic Screening: Normal Maternal Ultrasounds/Referrals: IUGR Fetal Ultrasounds or other Referrals:  None Maternal Substance Abuse:  No Significant Maternal Medications:  None Significant Maternal Lab Results:  Group B Strep negative Other Comments:  None  Review of Systems  Constitutional: Negative.   All other systems reviewed and are negative.  Maternal Medical History:  Fetal activity: Perceived fetal activity is normal.   Last perceived fetal movement was within the past hour.    Prenatal complications: IUGR.   Prenatal Complications - Diabetes: none.      Blood pressure 98/69, pulse 66, temperature 97.9 F (36.6 C), temperature source Oral, resp. rate 18, height 5\' 7"  (1.702 m), weight 73.5 kg, SpO2 99 %. Maternal Exam:  Uterine Assessment: Contraction strength is mild.  Abdomen: Patient reports no abdominal tenderness. Surgical scars: low transverse.   Fetal presentation: vertex  Introitus: Normal vulva. Normal vagina.  Ferning test: not done.  Nitrazine test: not  done. Amniotic fluid character: not assessed.  Pelvis: questionable for delivery.   Cervix: Cervix evaluated by digital exam.     Physical Exam  Nursing note and vitals reviewed. Constitutional: She is oriented to person, place, and time. She appears well-developed and well-nourished.  HENT:  Head: Normocephalic and atraumatic.  Neck: Normal range of motion. Neck supple.  Cardiovascular: Normal rate and regular rhythm.  Respiratory: Effort normal and breath sounds normal.  GI: Soft. Bowel sounds are normal.  Genitourinary:    Vulva, vagina and uterus normal.   Neurological: She is alert and oriented to person, place, and time. She has normal reflexes.  Skin: Skin is warm and dry.  Psychiatric: She has a normal mood and affect.    Prenatal labs: ABO, Rh:   Antibody:   Rubella:   RPR: NON REACTIVE (09/22 0751)  HBsAg:    HIV:    GBS:     Assessment/Plan: 38+ wk IUP Rpt csection  Surgical consent done Risks vs benefits of procedure discussed   Shelley Sawyer J 05/08/2019, 6:40 AM

## 2019-05-08 NOTE — Anesthesia Postprocedure Evaluation (Signed)
Anesthesia Post Note  Patient: Shelley Sawyer  Procedure(s) Performed: Repeat CESAREAN SECTION (N/A )     Patient location during evaluation: PACU Anesthesia Type: Spinal Level of consciousness: oriented and awake and alert Pain management: pain level controlled Vital Signs Assessment: post-procedure vital signs reviewed and stable Respiratory status: spontaneous breathing, respiratory function stable and nonlabored ventilation Cardiovascular status: blood pressure returned to baseline and stable Postop Assessment: no headache, no backache, no apparent nausea or vomiting and spinal receding Anesthetic complications: no    Last Vitals:  Vitals:   05/08/19 1223 05/08/19 1339  BP: 106/71 111/79  Pulse: 68 68  Resp: 16 16  Temp: 36.7 C 36.5 C  SpO2:      Last Pain:  Vitals:   05/08/19 1339  TempSrc: Oral  PainSc:    Pain Goal:                   Lidia Collum

## 2019-05-08 NOTE — Anesthesia Preprocedure Evaluation (Signed)
Anesthesia Evaluation  Patient identified by MRN, date of birth, ID band Patient awake    Reviewed: Allergy & Precautions, H&P , NPO status , Patient's Chart, lab work & pertinent test results  History of Anesthesia Complications Negative for: history of anesthetic complications  Airway Mallampati: II  TM Distance: >3 FB Neck ROM: full    Dental no notable dental hx.    Pulmonary neg pulmonary ROS,    Pulmonary exam normal        Cardiovascular negative cardio ROS Normal cardiovascular exam     Neuro/Psych negative neurological ROS  negative psych ROS   GI/Hepatic negative GI ROS, Neg liver ROS,   Endo/Other  negative endocrine ROS  Renal/GU negative Renal ROS  negative genitourinary   Musculoskeletal   Abdominal   Peds  Hematology negative hematology ROS (+)   Anesthesia Other Findings plt 180, hgb 10.9 Previous C/S x1  Reproductive/Obstetrics (+) Pregnancy                             Anesthesia Physical Anesthesia Plan  ASA: II  Anesthesia Plan: Spinal   Post-op Pain Management:    Induction:   PONV Risk Score and Plan: Ondansetron and Treatment may vary due to age or medical condition  Airway Management Planned:   Additional Equipment:   Intra-op Plan:   Post-operative Plan:   Informed Consent: I have reviewed the patients History and Physical, chart, labs and discussed the procedure including the risks, benefits and alternatives for the proposed anesthesia with the patient or authorized representative who has indicated his/her understanding and acceptance.       Plan Discussed with:   Anesthesia Plan Comments:         Anesthesia Quick Evaluation

## 2019-05-08 NOTE — Op Note (Signed)
Cesarean Section Procedure Note  Indications: IUGR with abnormal dopplers  Pre-operative Diagnosis: 38 week 6 day pregnancy. Previous csection for rpt  Post-operative Diagnosis: same  Surgeon: Lovenia Kim   Assistants: Sigmon, CNM  Anesthesia: Local anesthesia 0.25.% bupivacaine and Spinal anesthesia  ASA Class: 2  Procedure Details  The patient was seen in the Holding Room. The risks, benefits, complications, treatment options, and expected outcomes were discussed with the patient.  The patient concurred with the proposed plan, giving informed consent. The risks of anesthesia, infection, bleeding and possible injury to other organs discussed. Injury to bowel, bladder, or ureter with possible need for repair discussed. Possible need for transfusion with secondary risks of hepatitis or HIV acquisition discussed. Post operative complications to include but not limited to DVT, PE and Pneumonia noted. The site of surgery properly noted/marked. The patient was taken to Operating Room # A, identified as Shelley Sawyer and the procedure verified as C-Section Delivery. A Time Out was held and the above information confirmed.  After induction of anesthesia, the patient was draped and prepped in the usual sterile manner. A Pfannenstiel incision was made and carried down through the subcutaneous tissue to the fascia. Fascial incision was made and extended transversely using Mayo scissors. The fascia was separated from the underlying rectus tissue superiorly and inferiorly. The peritoneum was identified and entered. Peritoneal incision was extended longitudinally. The utero-vesical peritoneal reflection was incised transversely and the bladder flap was bluntly freed from the lower uterine segment. A low transverse uterine incision(Kerr hysterotomy) was made. Delivered from OA presentation was a  female with Apgar scores of 9 at one minute and 9 at five minutes. Bulb suctioning gently performed. Neonatal  team in attendance.After the umbilical cord was clamped and cut cord blood was obtained for evaluation. The placenta was removed intact and appeared normal. The uterus was curetted with a dry lap pack. Good hemostasis was noted.The uterine outline, tubes and ovaries appeared normal. The uterine incision was closed with running locked sutures of 0 Monocryl x 2 layers. Multiple interrupted sutures applied to incision for hemostasis.Hemostasis was observed. The parietal peritoneum was closed with a running 2-0 Monocryl suture. The fascia was then reapproximated with running sutures of 0 Monocryl. The skin was reapproximated with 3-0 monocryl after [place] closure with 2-0 plain.  Instrument, sponge, and needle counts were correct prior the abdominal closure and at the conclusion of the case.   Findings: FTLF, anterior placenta, poorly developed LUS. Left lateral extension.  Estimated Blood Loss:  900         Drains: foley                 Specimens: placenta                 Complications:  None; patient tolerated the procedure well.         Disposition: PACU - hemodynamically stable.         Condition: stable  Attending Attestation: I performed the procedure.

## 2019-05-08 NOTE — Progress Notes (Signed)
Patient ID: Shelley Sawyer, female   DOB: [birth year], 33 y.o.   MRN: [id number] Patient seen and examined. Consent witnessed and signed. No changes noted. Update completed. BP 98/69   Pulse 66   Temp 97.9 F (36.6 C) (Oral)   Resp 18   Ht 5\' 7"  (1.702 m)   Wt 73.5 kg   SpO2 99%   BMI 25.37 kg/m   CBC    Component Value Date/Time   WBC 8.0 05/06/2019 0751   RBC 3.90 05/06/2019 0751   HGB 10.9 (L) 05/06/2019 0751   HCT 33.4 (L) 05/06/2019 0751   PLT 180 05/06/2019 0751   MCV 85.6 05/06/2019 0751   MCH 27.9 05/06/2019 0751   MCHC 32.6 05/06/2019 0751   RDW 13.3 05/06/2019 0751

## 2019-05-08 NOTE — Transfer of Care (Signed)
Immediate Anesthesia Transfer of Care Note  Patient: Shelley Sawyer  Procedure(s) Performed: Repeat CESAREAN SECTION (N/A )  Patient Location: PACU  Anesthesia Type:Spinal  Level of Consciousness: awake, alert  and oriented  Airway & Oxygen Therapy: Patient Spontanous Breathing  Post-op Assessment: Report given to RN and Post -op Vital signs reviewed and stable  Post vital signs: Reviewed and stable  Last Vitals:  Vitals Value Taken Time  BP 98/76 05/08/19 0911  Temp    Pulse 86 05/08/19 0915  Resp 17 05/08/19 0915  SpO2 100 % 05/08/19 0915  Vitals shown include unvalidated device data.  Last Pain:  Vitals:   05/08/19 0627  TempSrc: Oral         Complications: No apparent anesthesia complications

## 2019-05-09 DIAGNOSIS — O9902 Anemia complicating childbirth: Secondary | ICD-10-CM | POA: Diagnosis present

## 2019-05-09 LAB — CBC
HCT: 25.9 % — ABNORMAL LOW (ref 36.0–46.0)
Hemoglobin: 8.3 g/dL — ABNORMAL LOW (ref 12.0–15.0)
MCH: 27.8 pg (ref 26.0–34.0)
MCHC: 32 g/dL (ref 30.0–36.0)
MCV: 86.6 fL (ref 80.0–100.0)
Platelets: 167 10*3/uL (ref 150–400)
RBC: 2.99 MIL/uL — ABNORMAL LOW (ref 3.87–5.11)
RDW: 13.8 % (ref 11.5–15.5)
WBC: 8.4 10*3/uL (ref 4.0–10.5)
nRBC: 0 % (ref 0.0–0.2)

## 2019-05-09 LAB — BIRTH TISSUE RECOVERY COLLECTION (PLACENTA DONATION)

## 2019-05-09 MED ORDER — POLYSACCHARIDE IRON COMPLEX 150 MG PO CAPS
150.0000 mg | ORAL_CAPSULE | Freq: Every day | ORAL | Status: DC
  Administered 2019-05-09 – 2019-05-10 (×2): 150 mg via ORAL
  Filled 2019-05-09 (×2): qty 1

## 2019-05-09 MED ORDER — MAGNESIUM OXIDE 400 (241.3 MG) MG PO TABS
400.0000 mg | ORAL_TABLET | Freq: Every day | ORAL | Status: DC
  Administered 2019-05-09 – 2019-05-10 (×2): 400 mg via ORAL
  Filled 2019-05-09 (×2): qty 1

## 2019-05-09 NOTE — Progress Notes (Signed)
POD # 1  POD# 1  Delivering provider: Brien Few  Live born female  Birth Weight: 6 lb 6.7 oz (2910 g) APGAR: 8, 9  Newborn Delivery   Birth date/time: 05/08/2019 08:26:00 Delivery type: C-Section, Low Transverse Trial of labor: No C-section categorization: Primary      Baby name: Mercy Feeding: breast  Subjective:  Reports feeling well. Patient reports tolerating PO.   Breast symptoms: None Pain controlled with prescription NSAID's including toradol Denies HA/SOB/C/P/N/V/dizziness. Flatus not present. She reports vaginal bleeding as normal, without clots.  She is ambulating, urinating without difficulty.     Objective:   VS:    Vitals:   05/08/19 1609 05/08/19 2130 05/09/19 0051 05/09/19 0600  BP: 120/74 (!) 91/56 100/69 103/71  Pulse:  63  71  Resp:  18 18 16   Temp:  98.7 F (37.1 C) 98.4 F (36.9 C) 98.1 F (36.7 C)  TempSrc:  Oral Oral Oral  SpO2: 100% 100% 98% 99%  Weight:      Height:          Intake/Output Summary (Last 24 hours) at 05/09/2019 0916 Last data filed at 05/09/2019 0230 Gross per 24 hour  Intake 2442.28 ml  Output 1700 ml  Net 742.28 ml        Recent Labs    05/09/19 0622  WBC 8.4  HGB 8.3*  HCT 25.9*  PLT 167     Blood type: --/--/O POS, O POS Performed at [facility], [place] [address] [place], [place] [address]  [phone] [id number])  Rubella: Immune (03/12 0000)  Vaccines: TDaP UTD         Flu    UTD   Physical Exam:  General: alert, cooperative and no distress CV: Regular rate and rhythm Resp: clear Abdomen: soft, nontender, normal bowel sounds Incision: clean, dry and Honeycomb dressing intact Uterine Fundus: firm, below umbilicus, nontender Lochia: minimal Ext: extremities normal, atraumatic, no cyanosis or edema and no edema, redness or tenderness in the calves or thighs  Assessment/Plan: 33 y.o.   POD# 1. [id number]                  Principal Problem: Postpartum care following cesarean delivery  (9/24) Maternal anemia, with delivery  Doing well, stable.               Advance diet as tolerated Encourage rest when baby rests Breastfeeding support Encourage to ambulate, warm PO fluid, K-Pad to promote bowel motility Plan for oral Fe and Mag Ox for maternal anemia Routine post-op care  Plan for discharge tomorrow.  Juanna Cao, SNM, BSN 05/09/2019, 9:16 AM

## 2019-05-09 NOTE — Lactation Note (Signed)
This note was copied from a baby's chart. Lactation Consultation Note  Patient Name: Shelley Sawyer [id number] Date: 05/09/2019 Reason for consult: Follow-up assessment Baby is 26 hours old/4% weight loss.  Mom reports that feedings are going well.  She is not needing the nipple shield.  Discussed cluster feeding.  Baby was showing some feeding cues when FOB was holding baby.  Baby placed in mom's arms.  Mom hand expressed colostrum into baby's mouth.  Baby very sleepy and no latch achieved.  Instructed to continue to feed with cues and call for assist prn.  Maternal Data    Feeding Feeding Type: Breast Fed  LATCH Score Latch: Repeated attempts needed to sustain latch, nipple held in mouth throughout feeding, stimulation needed to elicit sucking reflex.  Audible Swallowing: A few with stimulation  Type of Nipple: Everted at rest and after stimulation  Comfort (Breast/Nipple): Soft / non-tender  Hold (Positioning): Assistance needed to correctly position infant at breast and maintain latch.  LATCH Score: 7  Interventions Interventions: Hand pump;Skin to skin;Assisted with latch  Lactation Tools Discussed/Used     Consult Status Consult Status: Follow-up Date: 05/10/19 Follow-up type: In-patient    Ave Filter 05/09/2019, 11:03 AM

## 2019-05-10 ENCOUNTER — Encounter (HOSPITAL_COMMUNITY): Payer: Self-pay | Admitting: Obstetrics and Gynecology

## 2019-05-10 MED ORDER — INFLUENZA VAC SPLIT QUAD 0.5 ML IM SUSY: 0.5000 mL | PREFILLED_SYRINGE | Freq: Once | INTRAMUSCULAR | Status: DC

## 2019-05-10 MED ORDER — ACETAMINOPHEN 500 MG PO TABS: 500.0000 mg | ORAL_TABLET | Freq: Four times a day (QID) | ORAL | 0 refills | Status: AC | PRN

## 2019-05-10 MED ORDER — MAGNESIUM OXIDE 400 (241.3 MG) MG PO TABS: 400.0000 mg | ORAL_TABLET | Freq: Every day | ORAL | Status: AC

## 2019-05-10 MED ORDER — SIMETHICONE 80 MG PO CHEW: 80.0000 mg | CHEWABLE_TABLET | ORAL | 0 refills | Status: AC | PRN

## 2019-05-10 MED ORDER — POLYSACCHARIDE IRON COMPLEX 150 MG PO CAPS: 150.0000 mg | ORAL_CAPSULE | Freq: Every day | ORAL | Status: AC

## 2019-05-10 MED ORDER — COCONUT OIL OIL: 1.0000 "application " | TOPICAL_OIL | 0 refills | Status: AC | PRN

## 2019-05-10 MED ORDER — OXYCODONE HCL 5 MG PO TABS: 5.0000 mg | ORAL_TABLET | Freq: Three times a day (TID) | ORAL | 0 refills | Status: AC | PRN

## 2019-05-10 MED ORDER — IBUPROFEN 800 MG PO TABS: 800.0000 mg | ORAL_TABLET | Freq: Four times a day (QID) | ORAL | 0 refills | Status: AC

## 2019-05-10 NOTE — Discharge Summary (Signed)
OB Discharge Summary  Patient Name: Shelley Sawyer DOB: November 08, 1985 MRN: [id number]  Date of admission: 05/08/2019 Delivering provider: Brien Few   Date of discharge: 05/10/2019  Admitting diagnosis: Previous Cesarean Section, INTRAUTERINE GROWTH RESTRICTION Intrauterine pregnancy: [redacted]w[redacted]d     Secondary diagnosis:Principal Problem:   Postpartum care following cesarean delivery (9/24) Active Problems:   Previous cesarean section   Maternal anemia, with delivery  Additional problems:none     Discharge diagnosis:  Patient Active Problem List   Diagnosis Date Noted  . Maternal anemia, with delivery 05/09/2019  . Previous cesarean section 05/08/2019  . Postpartum care following cesarean delivery (9/24) 05/08/2019  . 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 yo [id number] at [redacted]w[redacted]d was admitted to the hospital 05/08/2019 for scheduled cesarean section with the following indication:Elective Repeat.  Membrane Rupture Time/Date: 8:25 AM ,05/08/2019   Patient delivered a Viable infant.05/08/2019  Details of operation can be found in separate operative note.  Pateint had an uncomplicated postpartum course.  She is ambulating, tolerating a regular diet, passing flatus, and urinating well. Patient is discharged home in stable condition on  05/10/19         Physical exam  Vitals:   05/09/19 1628 05/09/19 2108 05/10/19 0031 05/10/19 0540  BP: 100/65 97/62  109/68  Pulse: 70 69  85  Resp: 18 18  18   Temp: 98.1 F (36.7 C) 98.6 F (37 C) 97.8 F (36.6 C) 98.4 F (36.9 C)  TempSrc: Oral Oral Oral Oral  SpO2:  98%  98%  Weight:      Height:       General: alert, cooperative and no distress Lochia: appropriate Uterine Fundus: firm Incision: Healing well with no significant drainage, Dressing is clean, dry, and  intact DVT Evaluation: No cords or calf tenderness. No significant calf/ankle edema. Labs: Lab Results  Component Value Date   WBC 8.4 05/09/2019   HGB 8.3 (L) 05/09/2019   HCT 25.9 (L) 05/09/2019   MCV 86.6 05/09/2019   PLT 167 05/09/2019   No flowsheet data found.  Vaccines: TDaP UTD         Flu    UTD  Discharge instruction: per After Visit Summary and "Baby and Me Booklet".  After Visit Meds:  Allergies as of 05/10/2019   No Known Allergies     Medication List    TAKE these medications   acetaminophen 500 MG tablet Commonly known as: TYLENOL Take 1 tablet (500 mg total) by mouth every 6 (six) hours as needed.   coconut oil Oil Apply 1 application topically as needed.   ibuprofen 800 MG tablet Commonly known as: ADVIL Take 1 tablet (800 mg total) by mouth every 6 (six) hours.   iron polysaccharides 150 MG capsule Commonly known as: NIFEREX Take 1 capsule (150 mg total) by mouth daily. Start taking on: May 11, 2019   magnesium oxide 400 (241.3 Mg) MG tablet Commonly known as: MAG-OX Take 1 tablet (400 mg total) by mouth daily. Start taking on: May 11, 2019  oxyCODONE 5 MG immediate release tablet Commonly known as: Roxicodone Take 1 tablet (5 mg total) by mouth every 8 (eight) hours as needed.   prenatal multivitamin Tabs tablet Take 1 tablet by mouth daily.   simethicone 80 MG chewable tablet Commonly known as: MYLICON Chew 1 tablet (80 mg total) by mouth as needed for flatulence.       Diet: routine diet  Activity: Advance as tolerated. Pelvic rest for 6 weeks.   Postpartum contraception: Not Discussed  Newborn Data: Live born female  Birth Weight: 6 lb 6.7 oz (2910 g) APGAR: 70, 9  Newborn Delivery   Birth date/time: 05/08/2019 08:26:00 Delivery type: C-Section, Low Transverse Trial of labor: No C-section categorization: Primary      named Mercy Baby Feeding: Breast Disposition:home with mother   Delivery  Report:  Review the Delivery Report for details.    Follow up: Follow-up Information    Brien Few, MD. Schedule an appointment as soon as possible for a visit in 6 week(s).   Specialty: Obstetrics and Gynecology Contact information: [place] [address] [phone]             Signed: Otilio Carpen, MSN 05/10/2019, 10:41 AM

## 2019-05-10 NOTE — Lactation Note (Signed)
This note was copied from a baby's chart. Lactation Consultation Note  Patient Name: Shelley Sawyer [id number] Date: 05/10/2019   P2, Baby 49 hours and latched upon entering. Sucks and swallows observed.  Mother's nipples round and evert when baby came off. Feed on demand with cues.  Goal 8-12+ times per day after first 24 hrs.  Place baby STS if not cueing.  Reviewed engorgement care and monitoring voids/stools.      Maternal Data    Feeding    LATCH Score                   Interventions    Lactation Tools Discussed/Used     Consult Status      Vivianne Master [facility] 05/10/2019, 9:54 AM

## 2019-05-10 NOTE — Discharge Instructions (Signed)
Postpartum Care After Cesarean Delivery This sheet gives you information about how to care for yourself from the time you deliver your baby to up to 6-12 weeks after delivery (postpartum period). Your health care provider may also give you more specific instructions. If you have problems or questions, contact your health care provider. Follow these instructions at home: Medicines  Take over-the-counter and prescription medicines only as told by your health care provider.  If you were prescribed an antibiotic medicine, take it as told by your health care provider. Do not stop taking the antibiotic even if you start to feel better.  Ask your health care provider if the medicine prescribed to you: ? Requires you to avoid driving or using heavy machinery. ? Can cause constipation. You may need to take actions to prevent or treat constipation, such as:  Drink enough fluid to keep your urine pale yellow.  Take over-the-counter or prescription medicines.  Eat foods that are high in fiber, such as beans, whole grains, and fresh fruits and vegetables.  Limit foods that are high in fat and processed sugars, such as fried or sweet foods. Activity  Gradually return to your normal activities as told by your health care provider.  Avoid activities that take a lot of effort and energy (are strenuous) until approved by your health care provider. Walking at a slow to moderate pace is usually safe. Ask your health care provider what activities are safe for you. ? Do not lift anything that is heavier than your baby or 10 lb (4.5 kg) as told by your health care provider. ? Do not vacuum, climb stairs, or drive a car for as long as told by your health care provider.  If possible, have someone help you at home until you are able to do your usual activities yourself.  Rest as much as possible. Try to rest or take naps while your baby is sleeping. Vaginal bleeding  It is normal to have vaginal bleeding  (lochia) after delivery. Wear a sanitary pad to absorb vaginal bleeding and discharge. ? During the first week after delivery, the amount and appearance of lochia is often similar to a menstrual period. ? Over the next few weeks, it will gradually decrease to a dry, yellow-brown discharge. ? For most women, lochia stops completely by 4-6 weeks after delivery. Vaginal bleeding can vary from woman to woman.  Change your sanitary pads frequently. Watch for any changes in your flow, such as: ? A sudden increase in volume. ? A change in color. ? Large blood clots.  If you pass a blood clot, save it and call your health care provider to discuss. Do not flush blood clots down the toilet before you get instructions from your health care provider.  Do not use tampons or douches until your health care provider says this is safe.  If you are not breastfeeding, your period should return 6-8 weeks after delivery. If you are breastfeeding, your period may return anytime between 8 weeks after delivery and the time that you stop breastfeeding. Perineal care   If your C-section (Cesarean section) was unplanned, and you were allowed to labor and push before delivery, you may have pain, swelling, and discomfort of the tissue between your vaginal opening and your anus (perineum). You may also have an incision in the tissue (episiotomy) or the tissue may have torn during delivery. Follow these instructions as told by your health care provider: ? Keep your perineum clean and dry as told by  your health care provider. Use medicated pads and pain-relieving sprays and creams as directed. ? If you have an episiotomy or vaginal tear, check the area every day for signs of infection. Check for:  Redness, swelling, or pain.  Fluid or blood.  Warmth.  Pus or a bad smell. ? You may be given a squirt bottle to use instead of wiping to clean the perineum area after you go to the bathroom. As you start healing, you may use  the squirt bottle before wiping yourself. Make sure to wipe gently. ? To relieve pain caused by an episiotomy, vaginal tear, or hemorrhoids, try taking a warm sitz bath 2-3 times a day. A sitz bath is a warm water bath that is taken while you are sitting down. The water should only come up to your hips and should cover your buttocks. Breast care  Within the first few days after delivery, your breasts may feel heavy, full, and uncomfortable (breast engorgement). You may also have milk leaking from your breasts. Your health care provider can suggest ways to help relieve breast discomfort. Breast engorgement should go away within a few days.  If you are breastfeeding: ? Wear a bra that supports your breasts and fits you well. ? Keep your nipples clean and dry. Apply creams and ointments as told by your health care provider. ? You may need to use breast pads to absorb milk leakage. ? You may have uterine contractions every time you breastfeed for several weeks after delivery. Uterine contractions help your uterus return to its normal size. ? If you have any problems with breastfeeding, work with your health care provider or a Science writer.  If you are not breastfeeding: ? Avoid touching your breasts as this can make your breasts produce more milk. ? Wear a well-fitting bra and use cold packs to help with swelling. ? Do not squeeze out (express) milk. This causes you to make more milk. Intimacy and sexuality  Ask your health care provider when you can engage in sexual activity. This may depend on your: ? Risk of infection. ? Healing rate. ? Comfort and desire to engage in sexual activity.  You are able to get pregnant after delivery, even if you have not had your period. If desired, talk with your health care provider about methods of family planning or birth control (contraception). Lifestyle  Do not use any products that contain nicotine or tobacco, such as cigarettes, e-cigarettes,  and chewing tobacco. If you need help quitting, ask your health care provider.  Do not drink alcohol, especially if you are breastfeeding. Eating and drinking   Drink enough fluid to keep your urine pale yellow.  Eat high-fiber foods every day. These may help prevent or relieve constipation. High-fiber foods include: ? Whole grain cereals and breads. ? Brown rice. ? Beans. ? Fresh fruits and vegetables.  Take your prenatal vitamins until your postpartum checkup or until your health care provider tells you it is okay to stop. General instructions  Keep all follow-up visits for you and your baby as told by your health care provider. Most women visit their health care provider for a postpartum checkup within the first 3-6 weeks after delivery. Contact a health care provider if you:  Feel unable to cope with the changes that a new baby brings to your life, and these feelings do not go away.  Feel unusually sad or worried.  Have breasts that are painful, hard, or turn red.  Have a fever.  Have trouble holding urine or keeping urine from leaking. °· Have little or no interest in activities you used to enjoy. °· Have not breastfed at all and you have not had a menstrual period for 12 weeks after delivery. °· Have stopped breastfeeding and you have not had a menstrual period for 12 weeks after you stopped breastfeeding. °· Have questions about caring for yourself or your baby. °· Pass a blood clot from your vagina. °Get help right away if you: °· Have chest pain. °· Have difficulty breathing. °· Have sudden, severe leg pain. °· Have severe pain or cramping in your abdomen. °· Bleed from your vagina so much that you fill more than one sanitary pad in one hour. Bleeding should not be heavier than your heaviest period. °· Develop a severe headache. °· Faint. °· Have blurred vision or spots in your vision. °· Have a bad-smelling vaginal discharge. °· Have thoughts about hurting yourself or your  baby. °If you ever feel like you may hurt yourself or others, or have thoughts about taking your own life, get help right away. You can go to your nearest emergency department or call: °· Your local emergency services (911 in the [place] °· A suicide crisis helpline, such as the National Suicide Prevention Lifeline at 1-800-273-8255. This is open 24 hours a day. °Summary °· The period of time from when you deliver your baby to up to 6-12 weeks after delivery is called the postpartum period. °· Gradually return to your normal activities as told by your health care provider. °· Keep all follow-up visits for you and your baby as told by your health care provider. °This information is not intended to replace advice given to you by your health care provider. Make sure you discuss any questions you have with your health care provider. °Document Released: 07/28/2000 Document Revised: 03/20/2018 Document Reviewed: 03/20/2018 °Elsevier Patient Education © 2020 [employer] ° °

## 2019-08-22 ENCOUNTER — Other Ambulatory Visit: Payer: Self-pay

## 2019-08-22 ENCOUNTER — Emergency Department (HOSPITAL_COMMUNITY): Payer: 59

## 2019-08-22 ENCOUNTER — Encounter (HOSPITAL_COMMUNITY): Payer: Self-pay

## 2019-08-22 ENCOUNTER — Emergency Department (HOSPITAL_COMMUNITY)
Admission: EM | Admit: 2019-08-22 | Discharge: 2019-08-22 | Disposition: A | Discharge status: Home / Self Care | Payer: 59 | Attending: Emergency Medicine | Admitting: Emergency Medicine

## 2019-08-22 DIAGNOSIS — Z79899 Other long term (current) drug therapy: Secondary | ICD-10-CM | POA: Diagnosis not present

## 2019-08-22 DIAGNOSIS — K6389 Other specified diseases of intestine: Secondary | ICD-10-CM

## 2019-08-22 DIAGNOSIS — N200 Calculus of kidney: Secondary | ICD-10-CM

## 2019-08-22 DIAGNOSIS — M545 Low back pain: Secondary | ICD-10-CM | POA: Diagnosis not present

## 2019-08-22 DIAGNOSIS — N938 Other specified abnormal uterine and vaginal bleeding: Secondary | ICD-10-CM | POA: Diagnosis not present

## 2019-08-22 DIAGNOSIS — R933 Abnormal findings on diagnostic imaging of other parts of digestive tract: Secondary | ICD-10-CM | POA: Diagnosis not present

## 2019-08-22 DIAGNOSIS — N132 Hydronephrosis with renal and ureteral calculous obstruction: Secondary | ICD-10-CM | POA: Insufficient documentation

## 2019-08-22 DIAGNOSIS — R109 Unspecified abdominal pain: Secondary | ICD-10-CM | POA: Diagnosis present

## 2019-08-22 LAB — URINALYSIS, ROUTINE W REFLEX MICROSCOPIC
Bilirubin Urine: NEGATIVE
Glucose, UA: NEGATIVE mg/dL
Ketones, ur: NEGATIVE mg/dL
Leukocytes,Ua: NEGATIVE
Nitrite: NEGATIVE
Protein, ur: NEGATIVE mg/dL
Specific Gravity, Urine: 1.044 — ABNORMAL HIGH (ref 1.005–1.030)
pH: 8 (ref 5.0–8.0)

## 2019-08-22 LAB — COMPREHENSIVE METABOLIC PANEL
ALT: 19 U/L (ref 0–44)
AST: 25 U/L (ref 15–41)
Albumin: 4.6 g/dL (ref 3.5–5.0)
Alkaline Phosphatase: 72 U/L (ref 38–126)
Anion gap: 10 (ref 5–15)
BUN: 16 mg/dL (ref 6–20)
CO2: 25 mmol/L (ref 22–32)
Calcium: 9.7 mg/dL (ref 8.9–10.3)
Chloride: 105 mmol/L (ref 98–111)
Creatinine, Ser: 0.78 mg/dL (ref 0.44–1.00)
GFR calc Af Amer: >60 mL/min (ref >60)
GFR calc non Af Amer: >60 mL/min (ref >60)
Glucose, Bld: 112 mg/dL — ABNORMAL HIGH (ref 70–99)
Potassium: 4.1 mmol/L (ref 3.5–5.1)
Sodium: 140 mmol/L (ref 135–145)
Total Bilirubin: 0.7 mg/dL (ref 0.3–1.2)
Total Protein: 7.5 g/dL (ref 6.5–8.1)

## 2019-08-22 LAB — CBC WITH DIFFERENTIAL/PLATELET
Abs Immature Granulocytes: 0.02 10*3/uL (ref 0.00–0.07)
Basophils Absolute: 0 10*3/uL (ref 0.0–0.1)
Basophils Relative: 0 %
Eosinophils Absolute: 0.1 10*3/uL (ref 0.0–0.5)
Eosinophils Relative: 1 %
HCT: 40 % (ref 36.0–46.0)
Hemoglobin: 12.1 g/dL (ref 12.0–15.0)
Immature Granulocytes: 0 %
Lymphocytes Relative: 19 %
Lymphs Abs: 1.5 10*3/uL (ref 0.7–4.0)
MCH: 25.6 pg — ABNORMAL LOW (ref 26.0–34.0)
MCHC: 30.3 g/dL (ref 30.0–36.0)
MCV: 84.6 fL (ref 80.0–100.0)
Monocytes Absolute: 0.6 10*3/uL (ref 0.1–1.0)
Monocytes Relative: 8 %
Neutro Abs: 5.7 10*3/uL (ref 1.7–7.7)
Neutrophils Relative %: 72 %
Platelets: 305 10*3/uL (ref 150–400)
RBC: 4.73 MIL/uL (ref 3.87–5.11)
RDW: 16.2 % — ABNORMAL HIGH (ref 11.5–15.5)
WBC: 7.9 10*3/uL (ref 4.0–10.5)
nRBC: 0 % (ref 0.0–0.2)

## 2019-08-22 LAB — HCG, QUANTITATIVE, PREGNANCY: hCG, Beta Chain, Quant, S: <1 m[IU]/mL (ref <5)

## 2019-08-22 LAB — LIPASE, BLOOD: Lipase: 31 U/L (ref 11–51)

## 2019-08-22 MED ORDER — FENTANYL CITRATE (PF) 100 MCG/2ML IJ SOLN
50.0000 ug | Freq: Once | INTRAMUSCULAR | Status: AC
  Administered 2019-08-22: 10:00:00 50 ug via INTRAVENOUS
  Filled 2019-08-22: qty 2

## 2019-08-22 MED ORDER — OXYCODONE-ACETAMINOPHEN 5-325 MG PO TABS: 1.0000 | ORAL_TABLET | Freq: Four times a day (QID) | ORAL | 0 refills | Status: DC | PRN

## 2019-08-22 MED ORDER — NAPROXEN 500 MG PO TABS: 500.0000 mg | ORAL_TABLET | Freq: Two times a day (BID) | ORAL | 0 refills | Status: AC | PRN

## 2019-08-22 MED ORDER — IOHEXOL 300 MG/ML  SOLN
100.0000 mL | Freq: Once | INTRAMUSCULAR | Status: AC | PRN
  Administered 2019-08-22: 11:00:00 100 mL via INTRAVENOUS

## 2019-08-22 MED ORDER — SODIUM CHLORIDE (PF) 0.9 % IJ SOLN
INTRAMUSCULAR | Status: AC
  Filled 2019-08-22: qty 50

## 2019-08-22 MED ORDER — KETOROLAC TROMETHAMINE 15 MG/ML IJ SOLN
15.0000 mg | Freq: Once | INTRAMUSCULAR | Status: AC
  Administered 2019-08-22: 15 mg via INTRAVENOUS
  Filled 2019-08-22: qty 1

## 2019-08-22 MED ORDER — ONDANSETRON HCL 4 MG/2ML IJ SOLN
4.0000 mg | Freq: Once | INTRAMUSCULAR | Status: AC
  Administered 2019-08-22: 10:00:00 4 mg via INTRAVENOUS
  Filled 2019-08-22: qty 2

## 2019-08-22 NOTE — ED Notes (Signed)
Spoke with Shelia in the lab - they will process urine culture.

## 2019-08-22 NOTE — ED Provider Notes (Signed)
[facility] DEPT Provider Note   CSN: [id number] Arrival date & time: 08/22/19  0850     History Chief Complaint  Patient presents with  . Abdominal Pain    Shelley Sawyer is a 34 y.o. female.  Presents to ER with chief complaint right side pain.  Said yesterday noted some right lower back pain, vent overnight and this morning said that the pain has been worsening, right upper part of abdomen, right side.  Comes and goes in waves, currently mild, but can be quite severe at times, sharp, stabbing pain.  Nonradiating.  Denies prior history of gallstones, gallbladder disease, kidney stones, appendicitis.  Noted mild vaginal bleeding over the past couple days.  No vaginal discharge.  C-section a few months ago, uncomplicated.  Currently breast-feeding.  No associated fever, nausea, vomiting, hematuria, dysuria.  Took oxycodone this morning with some relief.  Ibuprofen yesterday with some relief. HPI     Past Medical History:  Diagnosis Date  . Medical history non-contributory     Patient Active Problem List   Diagnosis Date Noted  . Maternal anemia, with delivery 05/09/2019  . Previous cesarean section 05/08/2019  . Postpartum care following cesarean delivery (9/24) 05/08/2019  . 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 CESAREAN SECTION N/A 07/16/2013   Procedure: CESAREAN SECTION;  Surgeon: Betsy Coder, MD;  Location: [facility];  Service: Obstetrics;  Laterality: N/A;  . CESAREAN SECTION N/A 05/08/2019   Procedure: Repeat CESAREAN SECTION;  Surgeon: Brien Few, MD;  Location: [facility];  Service: Obstetrics;  Laterality: N/A;  EDD: 05/16/19  . TONSILLECTOMY    . WISDOM TOOTH EXTRACTION       OB History    Gravida  4   Para  2   Term  2   Preterm      AB  2   Living  2     SAB  2   TAB      Ectopic      Multiple  0   Live Births  2           Family History    Problem Relation Age of Onset  . Diabetes Maternal Grandfather   . Cancer Maternal Grandfather        leaukemia  . Diabetes Paternal Grandfather     Social History   Tobacco Use  . Smoking status: Never Smoker  . Smokeless tobacco: Never Used  Substance Use Topics  . Alcohol use: No  . Drug use: No    Home Medications Prior to Admission medications   Medication Sig Start Date End Date Taking? Authorizing Provider  acetaminophen (TYLENOL) 500 MG tablet Take 1 tablet (500 mg total) by mouth every 6 (six) hours as needed. 05/10/19   Juliene Pina, CNM  coconut oil OIL Apply 1 application topically as needed. 05/10/19   Juliene Pina, CNM  ibuprofen (ADVIL) 800 MG tablet Take 1 tablet (800 mg total) by mouth every 6 (six) hours. 05/10/19   Juliene Pina, CNM  iron polysaccharides (NIFEREX) 150 MG capsule Take 1 capsule (150 mg total) by mouth daily. 05/11/19   Juliene Pina, CNM  magnesium oxide (MAG-OX) 400 (241.3 Mg) MG tablet Take 1 tablet (400 mg total) by mouth daily. 05/11/19   Juliene Pina, CNM  naproxen (NAPROSYN) 500 MG tablet Take 1 tablet (500 mg total) by mouth 2 (two) times daily as  needed for moderate pain. 08/22/19   Lucrezia Starch, MD  oxyCODONE (ROXICODONE) 5 MG immediate release tablet Take 1 tablet (5 mg total) by mouth every 8 (eight) hours as needed. 05/10/19 05/09/20  Juliene Pina, CNM  oxyCODONE-acetaminophen (PERCOCET) 5-325 MG tablet Take 1 tablet by mouth every 6 (six) hours as needed for severe pain. 08/22/19   Lucrezia Starch, MD  Prenatal Vit-Fe Fumarate-FA (PRENATAL MULTIVITAMIN) TABS tablet Take 1 tablet by mouth daily.    [provider]  simethicone (MYLICON) 80 MG chewable tablet Chew 1 tablet (80 mg total) by mouth as needed for flatulence. 05/10/19   Juliene Pina, CNM    Allergies    Patient has no known allergies.  Review of Systems   Review of Systems  Constitutional: Negative for chills and fever.  HENT: Negative for ear  pain and sore throat.   Eyes: Negative for pain and visual disturbance.  Respiratory: Negative for cough and shortness of breath.   Cardiovascular: Negative for chest pain and palpitations.  Gastrointestinal: Positive for abdominal pain. Negative for vomiting.  Genitourinary: Negative for dysuria and hematuria.  Musculoskeletal: Positive for back pain. Negative for arthralgias.  Skin: Negative for color change and rash.  Neurological: Negative for seizures and syncope.  All other systems reviewed and are negative.   Physical Exam Updated Vital Signs BP 106/76   Pulse 62   Temp 97.9 F (36.6 C) (Oral)   Resp 18   SpO2 98%   Physical Exam Vitals and nursing note reviewed.  Constitutional:      General: She is not in acute distress.    Appearance: She is well-developed.  HENT:     Head: Normocephalic and atraumatic.  Eyes:     Conjunctiva/sclera: Conjunctivae normal.  Cardiovascular:     Rate and Rhythm: Normal rate and regular rhythm.     Heart sounds: No murmur.  Pulmonary:     Effort: Pulmonary effort is normal. No respiratory distress.     Breath sounds: Normal breath sounds.  Abdominal:     Palpations: Abdomen is soft.     Comments: Right CVA tenderness, right flank tenderness to palpation, right upper quadrant tenderness but negative Murphy's, no rebound or guarding  Musculoskeletal:     Cervical back: Neck supple.  Skin:    General: Skin is warm and dry.     Capillary Refill: Capillary refill takes less than 2 seconds.  Neurological:     Mental Status: She is alert.     ED Results / Procedures / Treatments   Labs (all labs ordered are listed, but only abnormal results are displayed) Labs Reviewed  CBC WITH DIFFERENTIAL/PLATELET - Abnormal; Notable for the following components:      Result Value   MCH 25.6 (*)    RDW 16.2 (*)    All other components within normal limits  COMPREHENSIVE METABOLIC PANEL - Abnormal; Notable for the following components:    Glucose, Bld 112 (*)    All other components within normal limits  URINALYSIS, ROUTINE W REFLEX MICROSCOPIC - Abnormal; Notable for the following components:   Specific Gravity, Urine 1.044 (*)    Hgb urine dipstick MODERATE (*)    Bacteria, UA RARE (*)    All other components within normal limits  URINE CULTURE  LIPASE, BLOOD  HCG, QUANTITATIVE, PREGNANCY    EKG None  Radiology CT ABDOMEN PELVIS W CONTRAST  Result Date: 08/22/2019 CLINICAL DATA:  Lower abdominal pain, primarily right-sided EXAM: CT ABDOMEN  AND PELVIS WITH CONTRAST TECHNIQUE: Multidetector CT imaging of the abdomen and pelvis was performed using the standard protocol following bolus administration of intravenous contrast. CONTRAST:  138mL OMNIPAQUE IOHEXOL 300 MG/ML  SOLN COMPARISON:  None. FINDINGS: Lower chest: Lung bases are clear. Hepatobiliary: There is hepatic steatosis. Fatty infiltration is somewhat more notable at the level of the fissure for the ligamentum teres. No focal liver lesion identified beyond apparent fatty infiltration. Gallbladder wall is not appreciably thickened. There is no biliary duct dilatation. Pancreas: No pancreatic mass or inflammatory focus. Spleen: No splenic lesions are evident. Adrenals/Urinary Tract: Adrenals bilaterally appear unremarkable. There is no evident renal mass on either side. There is moderate hydronephrosis on the right. No hydronephrosis on the left. There is no intrarenal calculus on either side. There is a calculus at the L4 level in the uterus measuring 7 x 6 mm. No other ureteral calculi are evident. Urinary bladder is midline with wall thickness within normal limits. Stomach/Bowel: There is no appreciable bowel wall or mesenteric thickening. No evident bowel obstruction. The terminal ileum appears unremarkable. Note that at the level of the terminal ileum, there is soft tissue opacity within the medial cecum-ascending colon junction measuring 5.8 x 4.9 cm. This area is distinct  from surrounding stool appears masslike. No similar changes in bowel elsewhere. Vascular/Lymphatic: There is no abdominal aortic aneurysm. No vascular lesions are evident. Major venous structures appear patent. There is no adenopathy in the abdomen or pelvis. Reproductive: Uterus is anteverted. There are areas of inhomogeneous enhancement within the uterus which may represent small leiomyomas. No extrauterine pelvic mass evident. Other: The appendix appears normal. No abscess or ascites evident in the abdomen or pelvis. 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 No blastic or lytic bone lesions are evident. No intramuscular or abdominal wall lesions are appreciable. IMPRESSION: 1. There is a 7 x 6 mm calculus in the right ureter at L4 causing moderate hydronephrosis on the right. 2. There is a focal area of what appears to be infiltrative lesion arising in the cecum near the ileocecal valve. This area appears distinct from bowel and measures 5.8 x 4.9 cm. Neoplasm infiltrating the proximal right colon must be of concern. This area warrants direct visualization to further evaluate. 3. Appendix appears normal. No bowel obstruction. No abscess in the abdomen or pelvis. 4.  Apparent leiomyomatous change within the uterus. 5. 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 6.  Hepatic steatosis. Electronically Signed   By: Lowella Grip III M.D.   On: 08/22/2019 11:19    Procedures Procedures (including critical care time)  Medications Ordered in ED Medications  sodium chloride (PF) 0.9 % injection (has no administration in time range)  ondansetron (ZOFRAN) injection 4 mg (4 mg Intravenous Given 08/22/19 0933)  fentaNYL (SUBLIMAZE) injection 50 mcg (50 mcg Intravenous Given 08/22/19 0934)  iohexol (OMNIPAQUE) 300 MG/ML solution 100 mL (100 mLs Intravenous Contrast Given 08/22/19 1053)  ketorolac (TORADOL) 15 MG/ML injection 15 mg (15  mg Intravenous Given 08/22/19 1150)    ED Course  I have reviewed the triage vital signs and the nursing notes.  Pertinent labs & imaging results that were available during my care of the patient were reviewed by me and considered in my medical decision making (see chart for details).  Clinical Course as of Aug 21 1399  Fri Aug 22, 2019  1117 Pain markedly improved, will give dose of Toradol, noted 6  mm stone on CT with mild hydro-, urine in process   [RD]    Clinical Course User Index [RD] Lucrezia Starch, MD   MDM Rules/Calculators/A&P                      34 year old lady presents to ER with concern for right flank pain.  CT scan demonstrated 33mm ureteral stone with some hydronephrosis.  UA with few bacteria but negative nitrates, negative leukocytes, minimal WBCs, afebrile, doubt UTI.  Will send for culture.  Patient's pain is well controlled, no significant kidney dysfunction or metabolic derangement.  Believe she is appropriate for discharge and outpatient management at this time.  Recommended follow-up next week with urology.  On CT scan incidental finding of possible colon mass.  Discussed this with patient as well as the radiology recommendation that she have a colonoscopy.  Patient has no family history of colon cancer, non-smoker, no other medical problems.  Provided information for GI for further follow-up.  Additionally recommended recheck with primary doctor.  Reviewed return precautions in detail with patient as well as her husband over the speaker phone.  Provided Rx for naproxen and short Rx for Percocet.    After the discussed management above, the patient was determined to be safe for discharge.  The patient was in agreement with this plan and all questions regarding their care were answered.  ED return precautions were discussed and the patient will return to the ED with any significant worsening of condition.   Final Clinical Impression(s) / ED Diagnoses Final  diagnoses:  Nephrolithiasis  Colonic mass    Rx / DC Orders ED Discharge Orders         Ordered    naproxen (NAPROSYN) 500 MG tablet  2 times daily PRN     08/22/19 1212    oxyCODONE-acetaminophen (PERCOCET) 5-325 MG tablet  Every 6 hours PRN     08/22/19 1212           Lucrezia Starch, MD 08/22/19 1402

## 2019-08-22 NOTE — Discharge Instructions (Addendum)
Recommend taking prescribed pain medicine as needed.  Return to ER if you develop any fever, vomiting, worsening pain despite pain medicines.  The CT scan noted questional finding in your colon and the radiologist recommended having a colonoscopy.  Please follow-up with your primary doctor as well as gastroenterology regarding this finding.

## 2019-08-22 NOTE — ED Notes (Signed)
URINE CULTURE SENT TO LAB WITH URINE SAMPLE

## 2019-08-22 NOTE — ED Triage Notes (Signed)
EMS reports from home, Pt c/o back pain yesterday today increased to sharp RUQ abdominal pain, painful upon palpation with general tenderness. Pt had C-section 3 months ago. Breast feeding currently.   BP 110/80 HR 60 RR 18 Sp02 99 RA CBG 117

## 2019-08-23 LAB — URINE CULTURE: Culture: <10000 — AB

## 2019-09-26 ENCOUNTER — Other Ambulatory Visit: Payer: Self-pay | Admitting: Urology

## 2019-09-26 ENCOUNTER — Other Ambulatory Visit (HOSPITAL_COMMUNITY)
Admission: RE | Admit: 2019-09-26 | Discharge: 2019-09-26 | Disposition: A | Discharge status: Home / Self Care | Payer: 59 | Source: Ambulatory Visit | Attending: Urology | Admitting: Urology

## 2019-09-26 DIAGNOSIS — N200 Calculus of kidney: Secondary | ICD-10-CM

## 2019-09-26 DIAGNOSIS — Z01812 Encounter for preprocedural laboratory examination: Secondary | ICD-10-CM | POA: Insufficient documentation

## 2019-09-26 DIAGNOSIS — Z20822 Contact with and (suspected) exposure to covid-19: Secondary | ICD-10-CM | POA: Insufficient documentation

## 2019-09-26 LAB — SARS CORONAVIRUS 2 (TAT 6-24 HRS): SARS Coronavirus 2: NEGATIVE

## 2019-09-29 ENCOUNTER — Encounter (HOSPITAL_BASED_OUTPATIENT_CLINIC_OR_DEPARTMENT_OTHER): Payer: Self-pay | Admitting: Urology

## 2019-09-29 ENCOUNTER — Ambulatory Visit (HOSPITAL_BASED_OUTPATIENT_CLINIC_OR_DEPARTMENT_OTHER)
Admission: RE | Admit: 2019-09-29 | Discharge: 2019-09-29 | Disposition: A | Discharge status: Home / Self Care | Payer: 59 | Attending: Urology | Admitting: Urology

## 2019-09-29 ENCOUNTER — Ambulatory Visit (HOSPITAL_COMMUNITY): Payer: 59

## 2019-09-29 ENCOUNTER — Encounter (HOSPITAL_BASED_OUTPATIENT_CLINIC_OR_DEPARTMENT_OTHER)
Admission: RE | Disposition: A | Discharge status: Home / Self Care | Payer: Self-pay | Source: Home / Self Care | Attending: Urology

## 2019-09-29 DIAGNOSIS — N201 Calculus of ureter: Secondary | ICD-10-CM | POA: Insufficient documentation

## 2019-09-29 DIAGNOSIS — N200 Calculus of kidney: Secondary | ICD-10-CM

## 2019-09-29 HISTORY — PX: EXTRACORPOREAL SHOCK WAVE LITHOTRIPSY: SHX1557

## 2019-09-29 LAB — POCT PREGNANCY, URINE: Preg Test, Ur: NEGATIVE

## 2019-09-29 SURGERY — LITHOTRIPSY, ESWL
Anesthesia: LOCAL | Laterality: Right

## 2019-09-29 MED ORDER — DIAZEPAM 5 MG PO TABS
ORAL_TABLET | ORAL | Status: AC
  Filled 2019-09-29: qty 2

## 2019-09-29 MED ORDER — DIPHENHYDRAMINE HCL 25 MG PO CAPS
ORAL_CAPSULE | ORAL | Status: AC
  Filled 2019-09-29: qty 1

## 2019-09-29 MED ORDER — DIAZEPAM 5 MG PO TABS
10.0000 mg | ORAL_TABLET | ORAL | Status: AC
  Administered 2019-09-29: 10 mg via ORAL
  Filled 2019-09-29: qty 2

## 2019-09-29 MED ORDER — CIPROFLOXACIN HCL 500 MG PO TABS
500.0000 mg | ORAL_TABLET | ORAL | Status: AC
  Administered 2019-09-29: 500 mg via ORAL
  Filled 2019-09-29: qty 1

## 2019-09-29 MED ORDER — TAMSULOSIN HCL 0.4 MG PO CAPS: 0.4000 mg | ORAL_CAPSULE | Freq: Every day | ORAL | 0 refills | Status: AC

## 2019-09-29 MED ORDER — SODIUM CHLORIDE 0.9 % IV SOLN
INTRAVENOUS | Status: DC
  Filled 2019-09-29: qty 1000

## 2019-09-29 MED ORDER — DIPHENHYDRAMINE HCL 25 MG PO CAPS
25.0000 mg | ORAL_CAPSULE | ORAL | Status: AC
  Administered 2019-09-29: 25 mg via ORAL
  Filled 2019-09-29: qty 1

## 2019-09-29 MED ORDER — CIPROFLOXACIN HCL 500 MG PO TABS
ORAL_TABLET | ORAL | Status: AC
  Filled 2019-09-29: qty 1

## 2019-09-29 MED ORDER — OXYCODONE-ACETAMINOPHEN 5-325 MG PO TABS: 1.0000 | ORAL_TABLET | Freq: Four times a day (QID) | ORAL | 0 refills | Status: AC | PRN

## 2019-09-29 NOTE — H&P (Signed)
Office Visit Report     09/26/2019   --------------------------------------------------------------------------------   Shelley Sawyer  MRN: [id number]  DOB: [birth year], 34 year old Female  SSN:    PRIMARY CARE:    REFERRING:    PROVIDER:  Louis Meckel, M.D.  LOCATION:  [facility] [phone]     --------------------------------------------------------------------------------   CC: Acute Kidney Stone  HPI: Shelley Sawyer is a 34 year-old female patient who is here for further eval and management of kidney stones.  She was diagnosed with a kidney stone on 08/22/2019. The patient presented to [facility] with symptoms of a kidney stone.   Her pain started about 08/20/2019. The pain is on the right side.   Abdomen/Pelvic CT: 7x42mm. The patient underwent CT scan prior to today's appointment.   The patient relates initially having flank pain. She is currently having irritative voiding symptoms. She denies having flank pain, back pain, groin pain, nausea, vomiting, fever, and chills. She has not caught a stone in her urine strainer since her symptoms began.   She has never had surgical treatment for calculi in the past. This is her first kidney stone.   The patient initially presented to the emergency department on January 6th with right-sided flank pain. Since then she has had intermittent pain, but mostly mild discomfort. Initially she did have some gross hematuria, but this seems to have cleared. She denies any hematuria dysuria.     ALLERGIES: None   MEDICATIONS: Prenatal     GU PSH: None   NON-GU PSH: Colonoscopy - 09/26/2019 C-Section, 2014 - 09/25/2018     GU PMH: None   NON-GU PMH: None   FAMILY HISTORY: 2 daughters - Daughter Kidney Stones - 85, Grandmother, Grandfather, Cousin   SOCIAL HISTORY: Marital Status: Married Preferred Language: English; Race: White Current Smoking Status: Patient has never smoked.   Tobacco Use Assessment Completed:  Used Tobacco in last 30 days? Does drink.  Drinks 2 caffeinated drinks per day. Patient's occupation Brewing technologist.    REVIEW OF SYSTEMS:    GU Review Female:   Patient reports frequent urination, burning /pain with urination, and get up at night to urinate. Patient denies hard to postpone urination, leakage of urine, stream starts and stops, trouble starting your stream, have to strain to urinate, and being pregnant.  Gastrointestinal (Upper):   Patient denies vomiting, indigestion/ heartburn, and nausea.  Gastrointestinal (Lower):   Patient reports diarrhea. Patient denies constipation.  Constitutional:   Patient denies fever, night sweats, weight loss, and fatigue.  Skin:   Patient denies skin rash/ lesion and itching.  Eyes:   Patient denies blurred vision and double vision.  Ears/ Nose/ Throat:   Patient denies sore throat and sinus problems.  Hematologic/Lymphatic:   Patient denies swollen glands and easy bruising.  Cardiovascular:   Patient denies leg swelling and chest pains.  Respiratory:   Patient denies cough and shortness of breath.  Endocrine:   Patient denies excessive thirst.  Musculoskeletal:   Patient denies back pain and joint pain.  Neurological:   Patient denies headaches and dizziness.  Psychologic:   Patient denies depression and anxiety.   Notes: Nocturia 2-3x pt c/o blood in urine and vaginal bleeding.    VITAL SIGNS:      09/26/2019 01:34 PM  Weight 135 lb / 61.23 kg  Height 67 in / 170.18 cm  BP 101/67 mmHg  Heart Rate 64 /min  Temperature 98.2 F / 36.7 C  BMI 21.1 kg/m  MULTI-SYSTEM PHYSICAL EXAMINATION:    Constitutional: Well-nourished. No physical deformities. Normally developed. Good grooming.  Neck: Neck symmetrical, not swollen. Normal tracheal position.  Respiratory: Normal breath sounds. No labored breathing, no use of accessory muscles.   Cardiovascular: Regular rate and rhythm. No murmur, no gallop. Normal temperature, normal extremity  pulses, no swelling, no varicosities.   Lymphatic: No enlargement of neck, axillae, groin.  Skin: No paleness, no jaundice, no cyanosis. No lesion, no ulcer, no rash.  Neurologic / Psychiatric: Oriented to time, oriented to place, oriented to person. No depression, no anxiety, no agitation.  Gastrointestinal: No mass, no tenderness, no rigidity, non obese abdomen.  Eyes: Normal conjunctivae. Normal eyelids.  Ears, Nose, Mouth, and Throat: Left ear no scars, no lesions, no masses. Right ear no scars, no lesions, no masses. Nose no scars, no lesions, no masses. Normal hearing. Normal lips.  Musculoskeletal: Normal gait and station of head and neck.     PAST DATA REVIEWED:  Source Of History:  Patient  Records Review:   Previous Doctor Records, Previous Patient Records, POC Tool  Urine Test Review:   Urinalysis  X-Ray Review: C.T. Abdomen/Pelvis: Reviewed Films. Discussed With Patient.     PROCEDURES:         KUB - [id number]  A single view of the abdomen is obtained. Renal shadows are easily visualized bilaterally. There are no stones appreciated within the expected location in either renal pelvis. Patient has a calcification overlying the sacral region on the right-hand side and the expected location of the right mid to distal ureter There are no additional calcifications along the expected location of either ureter bilaterally.  Gas pattern is grossly normal. No significant bony abnormalities.      Impression: The patient has a persistent stone on the right-hand side overlying the sacrum.  Patient confirmed No Neulasta OnPro Device.           Urinalysis w/Scope Dipstick Dipstick Cont'd Micro  Color: Yellow Bilirubin: Neg mg/dL WBC/hpf: 0 - 5/hpf  Appearance: Slightly Cloudy Ketones: Neg mg/dL RBC/hpf: 40 - 60/hpf  Specific Gravity: 1.025 Blood: 3+ ery/uL Bacteria: Rare (0-9/hpf)  pH: 6.0 Protein: Neg mg/dL Cystals: NS (Not Seen)  Glucose: Neg mg/dL Urobilinogen: 0.2 mg/dL Casts: NS  (Not Seen)    Nitrites: Neg Trichomonas: Not Present    Leukocyte Esterase: Neg leu/uL Mucous: Present      Epithelial Cells: 0 - 5/hpf      Yeast: NS (Not Seen)      Sperm: Not Present    ASSESSMENT:      ICD-10 Details  1 GU:   Ureteral calculus - N20.1    PLAN:            Medications New Meds: Tamsulosin Hcl 0.4 mg capsule 1 capsule PO Q HS   #15  0 Refill(s)            Orders Labs Urine Culture  X-Rays: KUB          Schedule Return Visit/Planned Activity: ASAP - Schedule Surgery          Document Letter(s):  Created for Patient: Clinical Summary    PLAN:  The risks, benefits and alternatives of RIGHT ESWL was discussed with the patient. I described the risks which include arrhythmia, kidney contusion, kidney hemorrhage, need for transfusion, back discomfort, flank ecchymosis, flank abrasion, inability to fracture the stone, inability to pass stone fragments, Steinstrasse, infection associated with obstructing stones, need for an alternative surgical procedure and possible  need for repeat shockwave lithotripsy.  The patient voices understanding and wishes to proceed.

## 2019-09-29 NOTE — Interval H&P Note (Signed)
History and Physical Interval Note:  09/29/2019 7:46 AM  Shelley Sawyer  has presented today for surgery, with the diagnosis of RIGHT URETERAL STONE.  The various methods of treatment have been discussed with the patient and family. After consideration of risks, benefits and other options for treatment, the patient has consented to  Procedure(s): EXTRACORPOREAL SHOCK WAVE LITHOTRIPSY (ESWL) (Right) as a surgical intervention.  The patient's history has been reviewed, patient examined, no change in status, stable for surgery.  I have reviewed the patient's chart and labs.  Questions were answered to the patient's satisfaction.     Conception Oms [name]

## 2019-09-29 NOTE — Op Note (Signed)
ESWL Operative Note  Treating Physician: Ellison Hughs, MD  Pre-op diagnosis: 6 mm right distal ureteral stone  Post-op diagnosis: Same   Procedure: RIGHT ESWL  See Aris Everts OP note scanned into chart. Also because of the size, density, location and other factors that cannot be anticipated I feel this will likely be a staged procedure. This fact supersedes any indication in the scanned [place] stone operative note to the contrary

## 2019-09-29 NOTE — Discharge Instructions (Signed)
Dietary Guidelines to Help Prevent Kidney Stones Kidney stones are deposits of minerals and salts that form inside your kidneys. Your risk of developing kidney stones may be greater depending on your diet, your lifestyle, the medicines you take, and whether you have certain medical conditions. Most people can reduce their chances of developing kidney stones by following the instructions below. Depending on your overall health and the type of kidney stones you tend to develop, your dietitian may give you more specific instructions. What are tips for following this plan? Reading food labels  Choose foods with "no salt added" or "low-salt" labels. Limit your sodium intake to less than 1500 mg per day.  Choose foods with calcium for each meal and snack. Try to eat about 300 mg of calcium at each meal. Foods that contain 200-500 mg of calcium per serving include: ? 8 oz (237 ml) of milk, fortified nondairy milk, and fortified fruit juice. ? 8 oz (237 ml) of kefir, yogurt, and soy yogurt. ? 4 oz (118 ml) of tofu. ? 1 oz of cheese. ? 1 cup (300 g) of dried figs. ? 1 cup (91 g) of cooked broccoli. ? 1-3 oz can of sardines or mackerel.  Most people need 1000 to 1500 mg of calcium each day. Talk to your dietitian about how much calcium is recommended for you. Shopping  Buy plenty of fresh fruits and vegetables. Most people do not need to avoid fruits and vegetables, even if they contain nutrients that may contribute to kidney stones.  When shopping for convenience foods, choose: ? Whole pieces of fruit. ? Premade salads with dressing on the side. ? Low-fat fruit and yogurt smoothies.  Avoid buying frozen meals or prepared deli foods.  Look for foods with live cultures, such as yogurt and kefir. Cooking  Do not add salt to food when cooking. Place a salt shaker on the table and allow each person to add his or her own salt to taste.  Use vegetable protein, such as beans, textured vegetable  protein (TVP), or tofu instead of meat in pasta, casseroles, and soups. Meal planning   Eat less salt, if told by your dietitian. To do this: ? Avoid eating processed or premade food. ? Avoid eating fast food.  Eat less animal protein, including cheese, meat, poultry, or fish, if told by your dietitian. To do this: ? Limit the number of times you have meat, poultry, fish, or cheese each week. Eat a diet free of meat at least 2 days a week. ? Eat only one serving each day of meat, poultry, fish, or seafood. ? When you prepare animal protein, cut pieces into small portion sizes. For most meat and fish, one serving is about the size of one deck of cards.  Eat at least 5 servings of fresh fruits and vegetables each day. To do this: ? Keep fruits and vegetables on hand for snacks. ? Eat 1 piece of fruit or a handful of berries with breakfast. ? Have a salad and fruit at lunch. ? Have two kinds of vegetables at dinner.  Limit foods that are high in a substance called oxalate. These include: ? Spinach. ? Rhubarb. ? Beets. ? Potato chips and french fries. ? Nuts.  If you regularly take a diuretic medicine, make sure to eat at least 1-2 fruits or vegetables high in potassium each day. These include: ? Avocado. ? Banana. ? Orange, prune, carrot, or tomato juice. ? Baked potato. ? Cabbage. ? Beans and split   peas. General instructions   Drink enough fluid to keep your urine clear or pale yellow. This is the most important thing you can do.  Talk to your health care provider and dietitian about taking daily supplements. Depending on your health and the cause of your kidney stones, you may be advised: ? Not to take supplements with vitamin C. ? To take a calcium supplement. ? To take a daily probiotic supplement. ? To take other supplements such as magnesium, fish oil, or vitamin B6.  Take all medicines and supplements as told by your health care provider.  Limit alcohol intake to no  more than 1 drink a day for nonpregnant women and 2 drinks a day for men. One drink equals 12 oz of beer, 5 oz of wine, or 1 oz of hard liquor.  Lose weight if told by your health care provider. Work with your dietitian to find strategies and an eating plan that works best for you. What foods are not recommended? Limit your intake of the following foods, or as told by your dietitian. Talk to your dietitian about specific foods you should avoid based on the type of kidney stones and your overall health. Grains Breads. Bagels. Rolls. Baked goods. Salted crackers. Cereal. Pasta. Vegetables Spinach. Rhubarb. Beets. Canned vegetables. Pickles. Olives. Meats and other protein foods Nuts. Nut butters. Large portions of meat, poultry, or fish. Salted or cured meats. Deli meats. Hot dogs. Sausages. Dairy Cheese. Beverages Regular soft drinks. Regular vegetable juice. Seasonings and other foods Seasoning blends with salt. Salad dressings. Canned soups. Soy sauce. Ketchup. Barbecue sauce. Canned pasta sauce. Casseroles. Pizza. Lasagna. Frozen meals. Potato chips. French fries. Summary  You can reduce your risk of kidney stones by making changes to your diet.  The most important thing you can do is drink enough fluid. You should drink enough fluid to keep your urine clear or pale yellow.  Ask your health care provider or dietitian how much protein from animal sources you should eat each day, and also how much salt and calcium you should have each day. This information is not intended to replace advice given to you by your health care provider. Make sure you discuss any questions you have with your health care provider. Document Revised: 11/20/2018 Document Reviewed: 07/11/2016 Elsevier Patient Education  2020 Elsevier Inc.  

## 2020-04-21 ENCOUNTER — Other Ambulatory Visit: Payer: Self-pay

## 2020-05-20 ENCOUNTER — Other Ambulatory Visit: Payer: 59

## 2020-09-20 ENCOUNTER — Telehealth: Payer: Self-pay

## 2020-09-20 NOTE — Telephone Encounter (Signed)
Schedule appt with Dr. Maudie Mercury  Thanks

## 2020-09-20 NOTE — Telephone Encounter (Signed)
Pt scheduled  

## 2020-09-20 NOTE — Telephone Encounter (Signed)
Pt is scheduled as new pt in July. She is having an acute issue after having covid. She is having a hoarse voice and phlegm in her throat that she can't get rid of. Pt is asking if she can be seen. Please advise how to schedule. Could also potentially schedule virtual with Dr. Maudie Mercury

## 2020-09-21 ENCOUNTER — Telehealth (INDEPENDENT_AMBULATORY_CARE_PROVIDER_SITE_OTHER): Payer: 59 | Admitting: Family Medicine

## 2020-09-21 ENCOUNTER — Telehealth: Payer: 59 | Admitting: Family Medicine

## 2020-09-21 DIAGNOSIS — R0981 Nasal congestion: Secondary | ICD-10-CM

## 2020-09-21 DIAGNOSIS — U071 COVID-19: Secondary | ICD-10-CM

## 2020-09-21 DIAGNOSIS — R059 Cough, unspecified: Secondary | ICD-10-CM

## 2020-09-21 MED ORDER — BENZONATATE 100 MG PO CAPS: 100.0000 mg | ORAL_CAPSULE | Freq: Three times a day (TID) | ORAL | 0 refills | Status: AC | PRN

## 2020-09-21 NOTE — Progress Notes (Signed)
Virtual Visit via Video Note  I connected with Shelley Sawyer  on 09/21/20 at 11:40 AM EST by a video enabled telemedicine application and verified that I am speaking with the correct person using two identifiers.  Location patient: home, [place] Location provider:work or home office Persons participating in the virtual visit: patient, provider  I discussed the limitations of evaluation and management by telemedicine and the availability of in person appointments. The patient expressed understanding and agreed to proceed.   HPI:  Acute telemedicine visit for COVID19 : -Onset: 09/09/20 -Symptoms include: sore throat, headache and fatigue initially, now with some persistent sinus congestion, PND, sore throat, hoarseness, cough  -Denies:fevers, SOB, CP, vomiting/diarrhea, inability to eat/drink/get out of bed, thick or discolored sinus drainage, wheeze, sinus pain, malaise -Has tried:dayquil, nyquil -Pertinent past medical history:denies any -Pertinent medication allergies: nkda -denies any chance of pregnancy -COVID-19 vaccine status: vaccinated x2  ROS: See pertinent positives and negatives per HPI.  Past Medical History:  Diagnosis Date  . Medical history non-contributory     Past Surgical History:  Procedure Laterality Date  . CESAREAN SECTION N/A 07/16/2013   Procedure: CESAREAN SECTION;  Surgeon: Betsy Coder, MD;  Location: [facility];  Service: Obstetrics;  Laterality: N/A;  . CESAREAN SECTION N/A 05/08/2019   Procedure: Repeat CESAREAN SECTION;  Surgeon: Brien Few, MD;  Location: [facility];  Service: Obstetrics;  Laterality: N/A;  EDD: 05/16/19  . EXTRACORPOREAL SHOCK WAVE LITHOTRIPSY Right 09/29/2019   Procedure: EXTRACORPOREAL SHOCK WAVE LITHOTRIPSY (ESWL);  Surgeon: Ceasar Mons, MD;  Location: [facility];  Service: Urology;  Laterality: Right;  . TONSILLECTOMY    . WISDOM TOOTH EXTRACTION       Current Outpatient Medications:  .  benzonatate  (TESSALON PERLES) 100 MG capsule, Take 1 capsule (100 mg total) by mouth 3 (three) times daily as needed., Disp: 20 capsule, Rfl: 0 .  acetaminophen (TYLENOL) 500 MG tablet, Take 1 tablet (500 mg total) by mouth every 6 (six) hours as needed., Disp: 30 tablet, Rfl: 0 .  coconut oil OIL, Apply 1 application topically as needed., Disp:  , Rfl: 0 .  ibuprofen (ADVIL) 800 MG tablet, Take 1 tablet (800 mg total) by mouth every 6 (six) hours., Disp: 30 tablet, Rfl: 0 .  iron polysaccharides (NIFEREX) 150 MG capsule, Take 1 capsule (150 mg total) by mouth daily., Disp:  , Rfl:  .  magnesium oxide (MAG-OX) 400 (241.3 Mg) MG tablet, Take 1 tablet (400 mg total) by mouth daily., Disp:  , Rfl:  .  naproxen (NAPROSYN) 500 MG tablet, Take 1 tablet (500 mg total) by mouth 2 (two) times daily as needed for moderate pain., Disp: 30 tablet, Rfl: 0 .  oxyCODONE-acetaminophen (PERCOCET) 5-325 MG tablet, Take 1 tablet by mouth every 6 (six) hours as needed for severe pain., Disp: 12 tablet, Rfl: 0 .  Prenatal Vit-Fe Fumarate-FA (PRENATAL MULTIVITAMIN) TABS tablet, Take 1 tablet by mouth daily., Disp: , Rfl:  .  simethicone (MYLICON) 80 MG chewable tablet, Chew 1 tablet (80 mg total) by mouth as needed for flatulence., Disp: 30 tablet, Rfl: 0 .  tamsulosin (FLOMAX) 0.4 MG CAPS capsule, Take 1 capsule (0.4 mg total) by mouth daily., Disp: 30 capsule, Rfl: 0  EXAM:  VITALS per patient if applicable:  GENERAL: alert, oriented, appears well and in no acute distress  HEENT: atraumatic, conjunttiva clear, no obvious abnormalities on inspection of external nose and ears  NECK: normal movements of the head and  neck  LUNGS: on inspection no signs of respiratory distress, breathing rate appears normal, no obvious gross SOB, gasping or wheezing  CV: no obvious cyanosis  MS: moves all visible extremities without noticeable abnormality  PSYCH/NEURO: pleasant and cooperative, no obvious depression or anxiety, speech and  thought processing grossly intact  ASSESSMENT AND PLAN:  Discussed the following assessment and plan:  COVID-19  Nasal congestion  Cough  -we discussed possible serious and likely etiologies, options for evaluation and workup, limitations of telemedicine visit vs in person visit, treatment, treatment risks and precautions. Pt prefers to treat via telemedicine empirically rather than in person at this moment. Suspect post covid/viral most likely vs other. Opted for tessalon rx for cough, nasal saline, short course nasal decongestant, salt water gargles, aleve if needed and other home care measures summarized in patient instructions.  Scheduled follow up with PCP offered: agrees to schedule follow up if needed.   Advised to seek prompt follow up or in person care if worsening, new symptoms arise, or if is not improving with treatment.    I discussed the assessment and treatment plan with the patient. The patient was provided an opportunity to ask questions and all were answered. The patient agreed with the plan and demonstrated an understanding of the instructions.     Lucretia Kern, DO

## 2020-09-21 NOTE — Patient Instructions (Addendum)
-  nasal saline twice daily  -humidifier or vaporizer at night  -Afrin nasal spray for 3 days  -can use aleve, warm tea with lemon, salt water gargles for sore throat if needed  -drink plenty of water and avoid dairy products until you are feeling better  -if taking Vit D do Vit D3 [phone] IU daily and if doing Vit C do 100-500mg  daily  -I sent the medication(s) we discussed to your pharmacy for cough: Meds ordered this encounter  Medications  . benzonatate (TESSALON PERLES) 100 MG capsule    Sig: Take 1 capsule (100 mg total) by mouth 3 (three) times daily as needed.    Dispense:  20 capsule    Refill:  0     I hope you are feeling better soon!  Seek in person care promptly if your symptoms worsen, new concerns arise or you are not improving with treatment.  It was nice to meet you today. I help [facility] out with telemedicine visits on Tuesdays and Thursdays and am available for visits on those days. If you have any concerns or questions following this visit please schedule a follow up visit with your Primary Care doctor or seek care at a local urgent care clinic to avoid delays in care.

## 2021-02-21 IMAGING — CT CT ABD-PELV W/ CM
2 of 4 series · 15 of 46 positions shown, 17 images · IV contrast (omnipaque)
Comparison: None.

CLINICAL DATA: Lower abdominal pain, primarily right-sided

EXAM:
CT ABDOMEN AND PELVIS WITH CONTRAST
TECHNIQUE: Multidetector CT imaging of the abdomen and pelvis was performed
using the standard protocol following bolus administration of
intravenous contrast.
CONTRAST:  100mL OMNIPAQUE IOHEXOL 300 MG/ML  SOLN

[Series 2: axial st · axial · 0.65mm/px · z∈[-452,-26]mm · 12 of 95 slices shown, 14 images]
[im 5/95  soft-tissue]
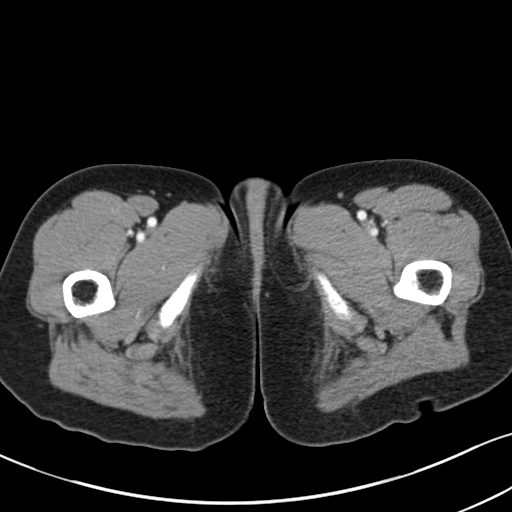
[im 5/95  bone]
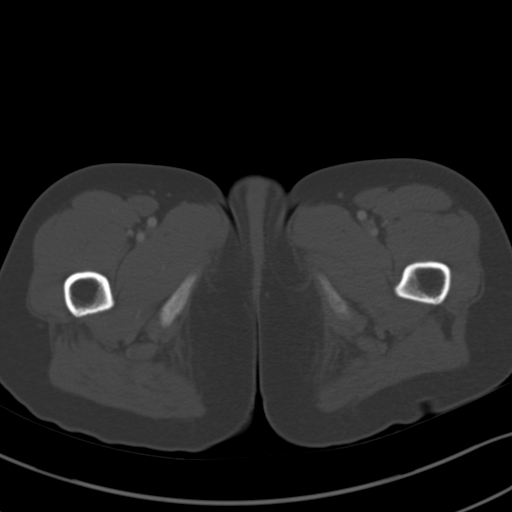
[im 15/95  soft-tissue]
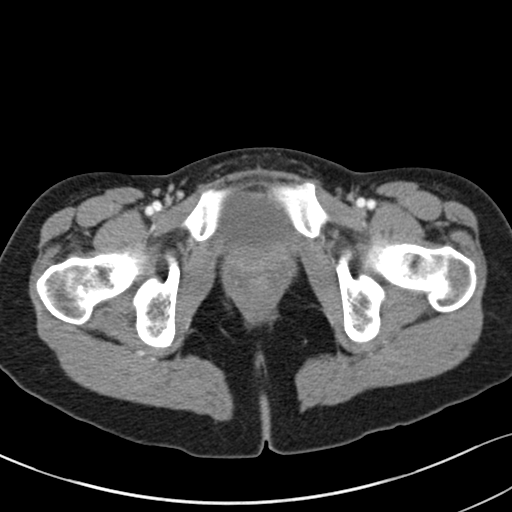
[im 19/95  soft-tissue]
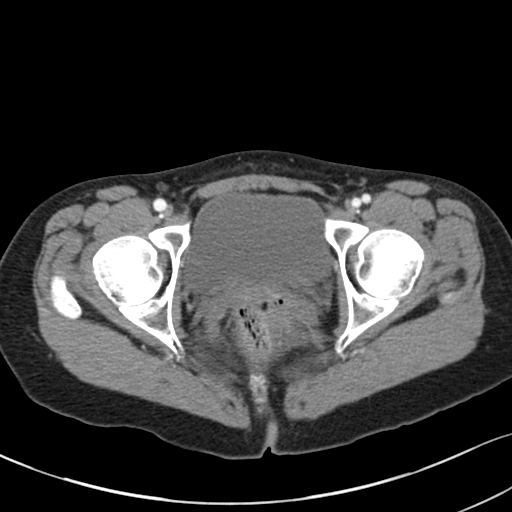
[im 29/95  soft-tissue]
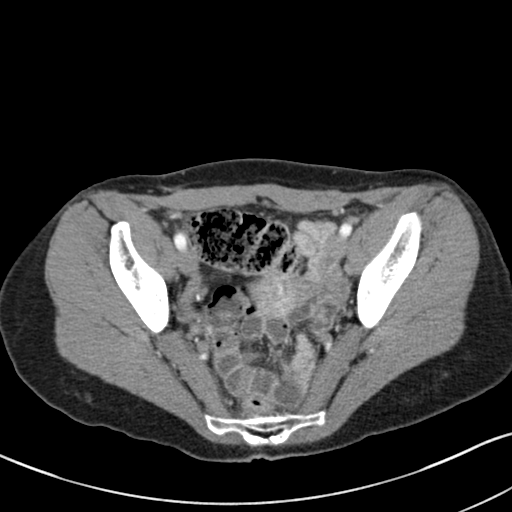
[im 38/95  soft-tissue]
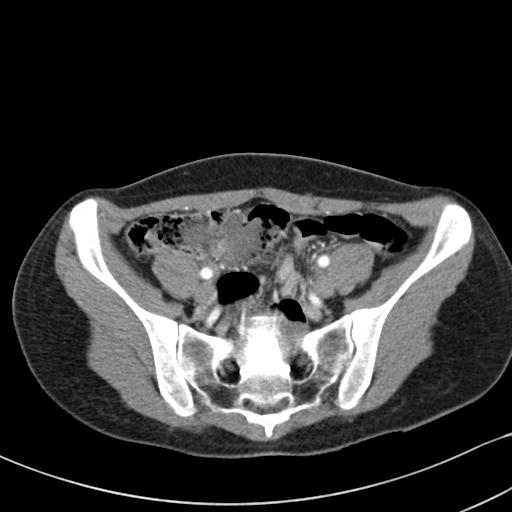
[im 43/95  soft-tissue]
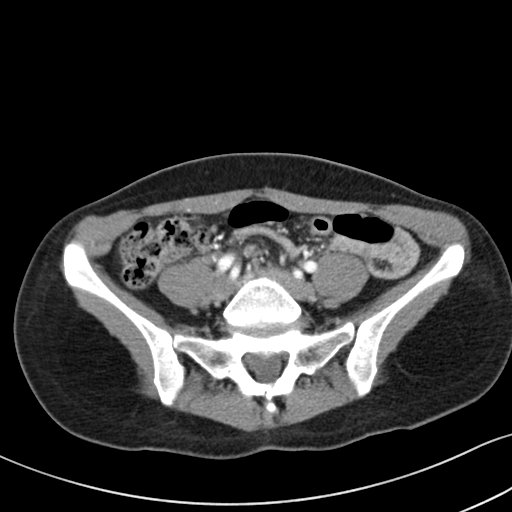
[im 52/95  soft-tissue]
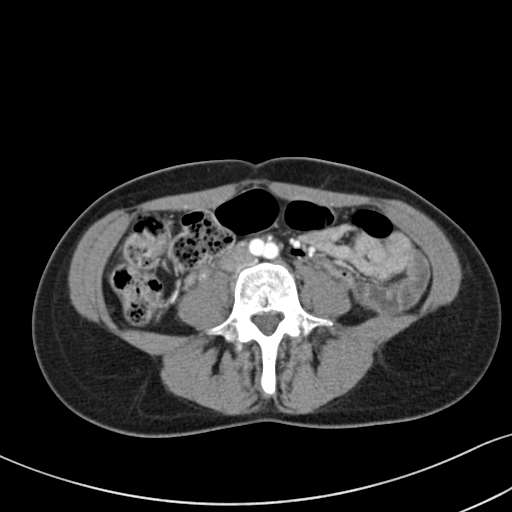
[im 57/95  soft-tissue]
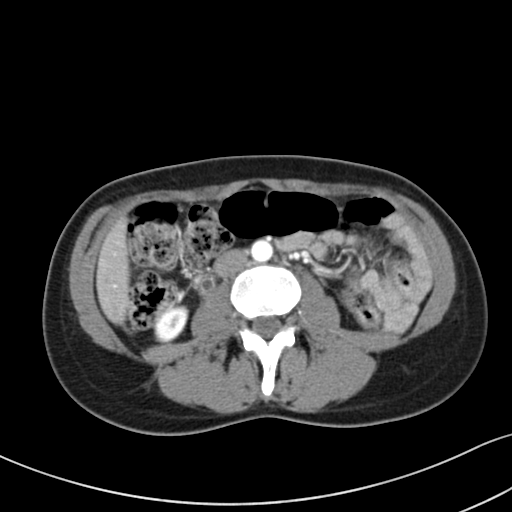
[im 66/95  soft-tissue]
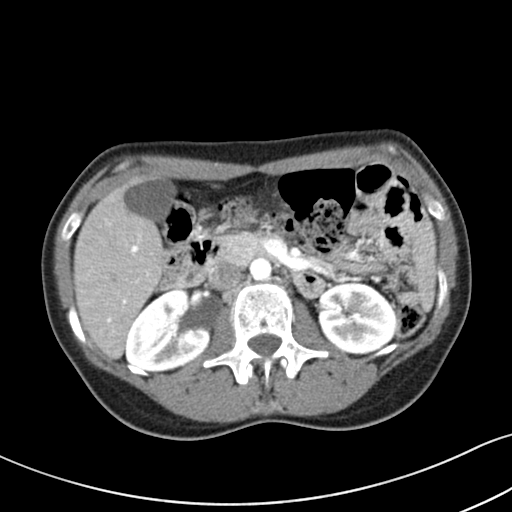
[im 66/95  bone]
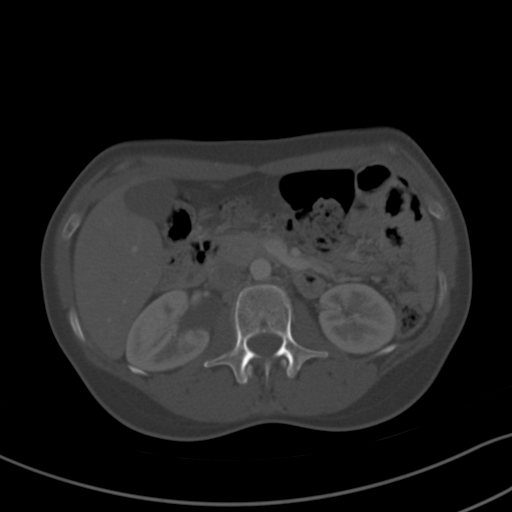
[im 76/95  soft-tissue]
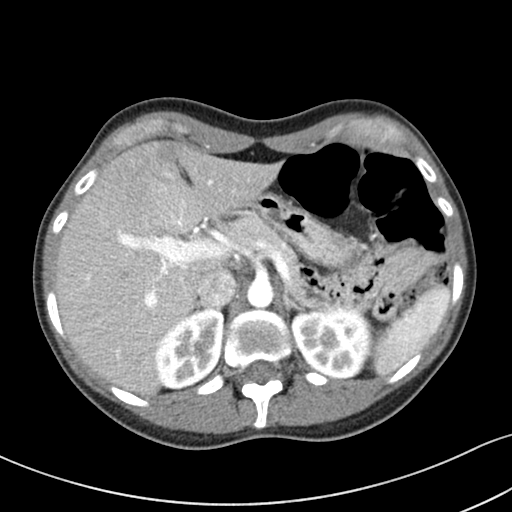
[im 80/95  soft-tissue]
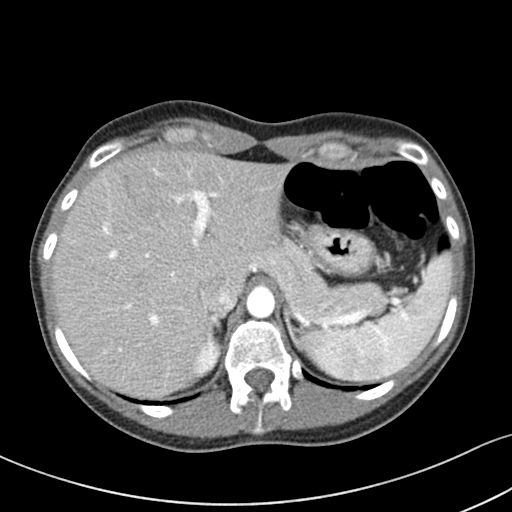
[im 90/95  soft-tissue]
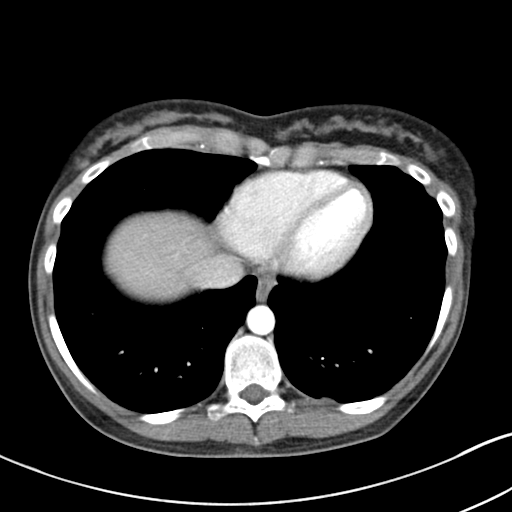

[Series 4: coronal st · coronal · 0.68mm/px · 3 of 123 slices shown]
[im 41/123  soft-tissue]
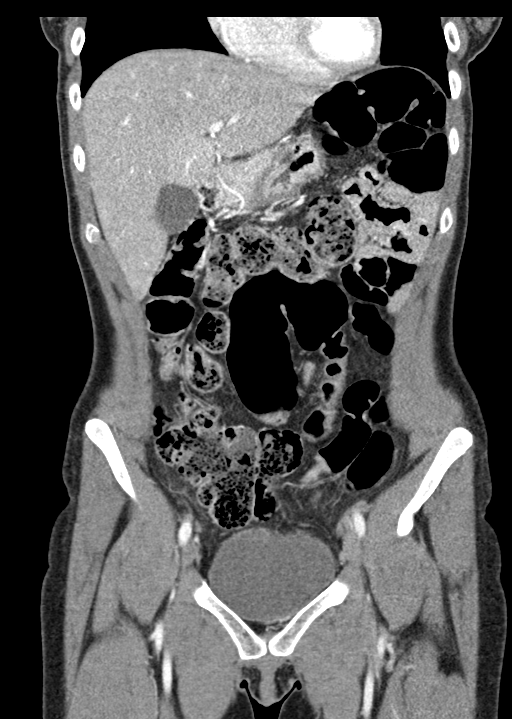
[im 55/123  soft-tissue]
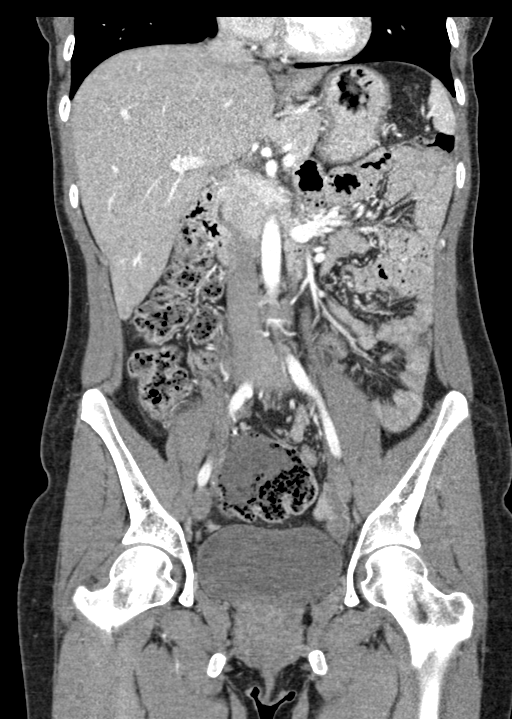
[im 68/123  soft-tissue]
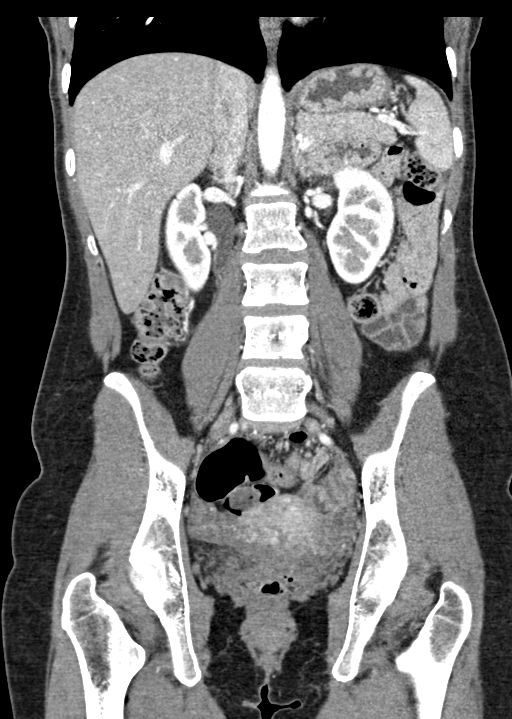

[15 of 46 positions shown; findings below may reference images not displayed]

FINDINGS: Lower chest: Lung bases are clear.

Hepatobiliary: There is hepatic steatosis. Fatty infiltration is
somewhat more notable at the level of the fissure for the ligamentum
teres. No focal liver lesion identified beyond apparent fatty
infiltration. Gallbladder wall is not appreciably thickened. There
is no biliary duct dilatation.

Pancreas: No pancreatic mass or inflammatory focus.

Spleen: No splenic lesions are evident.

Adrenals/Urinary Tract: Adrenals bilaterally appear unremarkable.
There is no evident renal mass on either side. There is moderate
hydronephrosis on the right. No hydronephrosis on the left. There is
no intrarenal calculus on either side. There is a calculus at the L4
level in the uterus measuring 7 x 6 mm. No other ureteral calculi
are evident. Urinary bladder is midline with wall thickness within
normal limits.

Stomach/Bowel: There is no appreciable bowel wall or mesenteric
thickening. No evident bowel obstruction. The terminal ileum appears
unremarkable. Note that at the level of the terminal ileum, there is
soft tissue opacity within the medial cecum-ascending colon junction
measuring 5.8 x 4.9 cm. This area is distinct from surrounding stool
appears masslike. No similar changes in bowel elsewhere.

Vascular/Lymphatic: There is no abdominal aortic aneurysm. No
vascular lesions are evident. Major venous structures appear patent.
There is no adenopathy in the abdomen or pelvis.

Reproductive: Uterus is anteverted. There are areas of inhomogeneous
enhancement within the uterus which may represent small leiomyomas.
No extrauterine pelvic mass evident.

Other: The appendix appears normal. No abscess or ascites evident in
the abdomen or pelvis.

Musculoskeletal: There are pars defects at L5 bilaterally with
slight anterolisthesis of L5 on S1. No blastic or lytic bone lesions
are evident. No intramuscular or abdominal wall lesions are
appreciable.
IMPRESSION: 1. There is a 7 x 6 mm calculus in the right ureter at L4 causing
moderate hydronephrosis on the right.

2. There is a focal area of what appears to be infiltrative lesion
arising in the cecum near the ileocecal valve. This area appears
distinct from bowel and measures 5.8 x 4.9 cm. Neoplasm infiltrating
the proximal right colon must be of concern. This area warrants
direct visualization to further evaluate.

3. Appendix appears normal. No bowel obstruction. No abscess in the
abdomen or pelvis.

4.  Apparent leiomyomatous change within the uterus.

5. Pars defects at L5 bilaterally with slight spondylolisthesis at
L5-S1.

6.  Hepatic steatosis.

## 2021-03-07 ENCOUNTER — Ambulatory Visit: Payer: 59 | Admitting: Family Medicine

## 2021-03-31 IMAGING — DX DG ABDOMEN 1V
2 series · 2 of 2 positions shown · non-contrast
Comparison: CT abdomen and pelvis-08/22/2019

CLINICAL DATA: Preoperative examination for right-sided
lithotripsy.

EXAM:
ABDOMEN - 1 VIEW

[abdomen kub (1 of 2)]
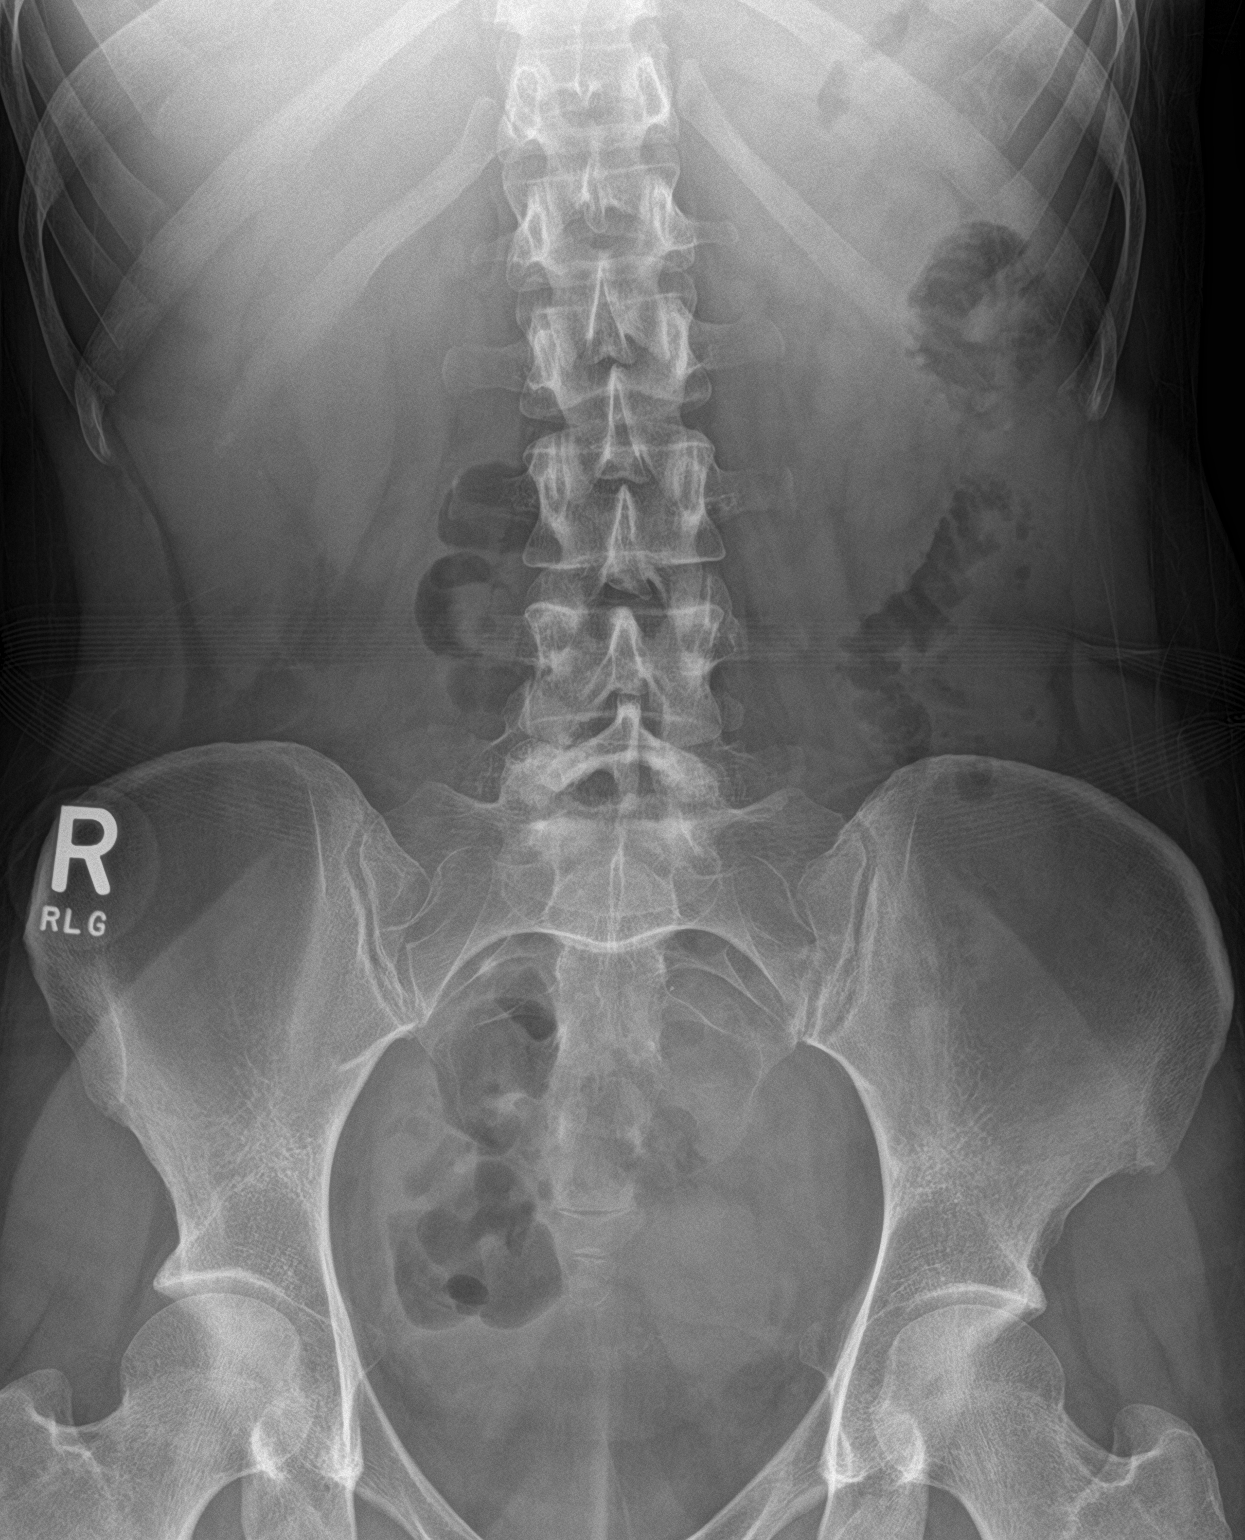

[abdomen kub (2 of 2)]
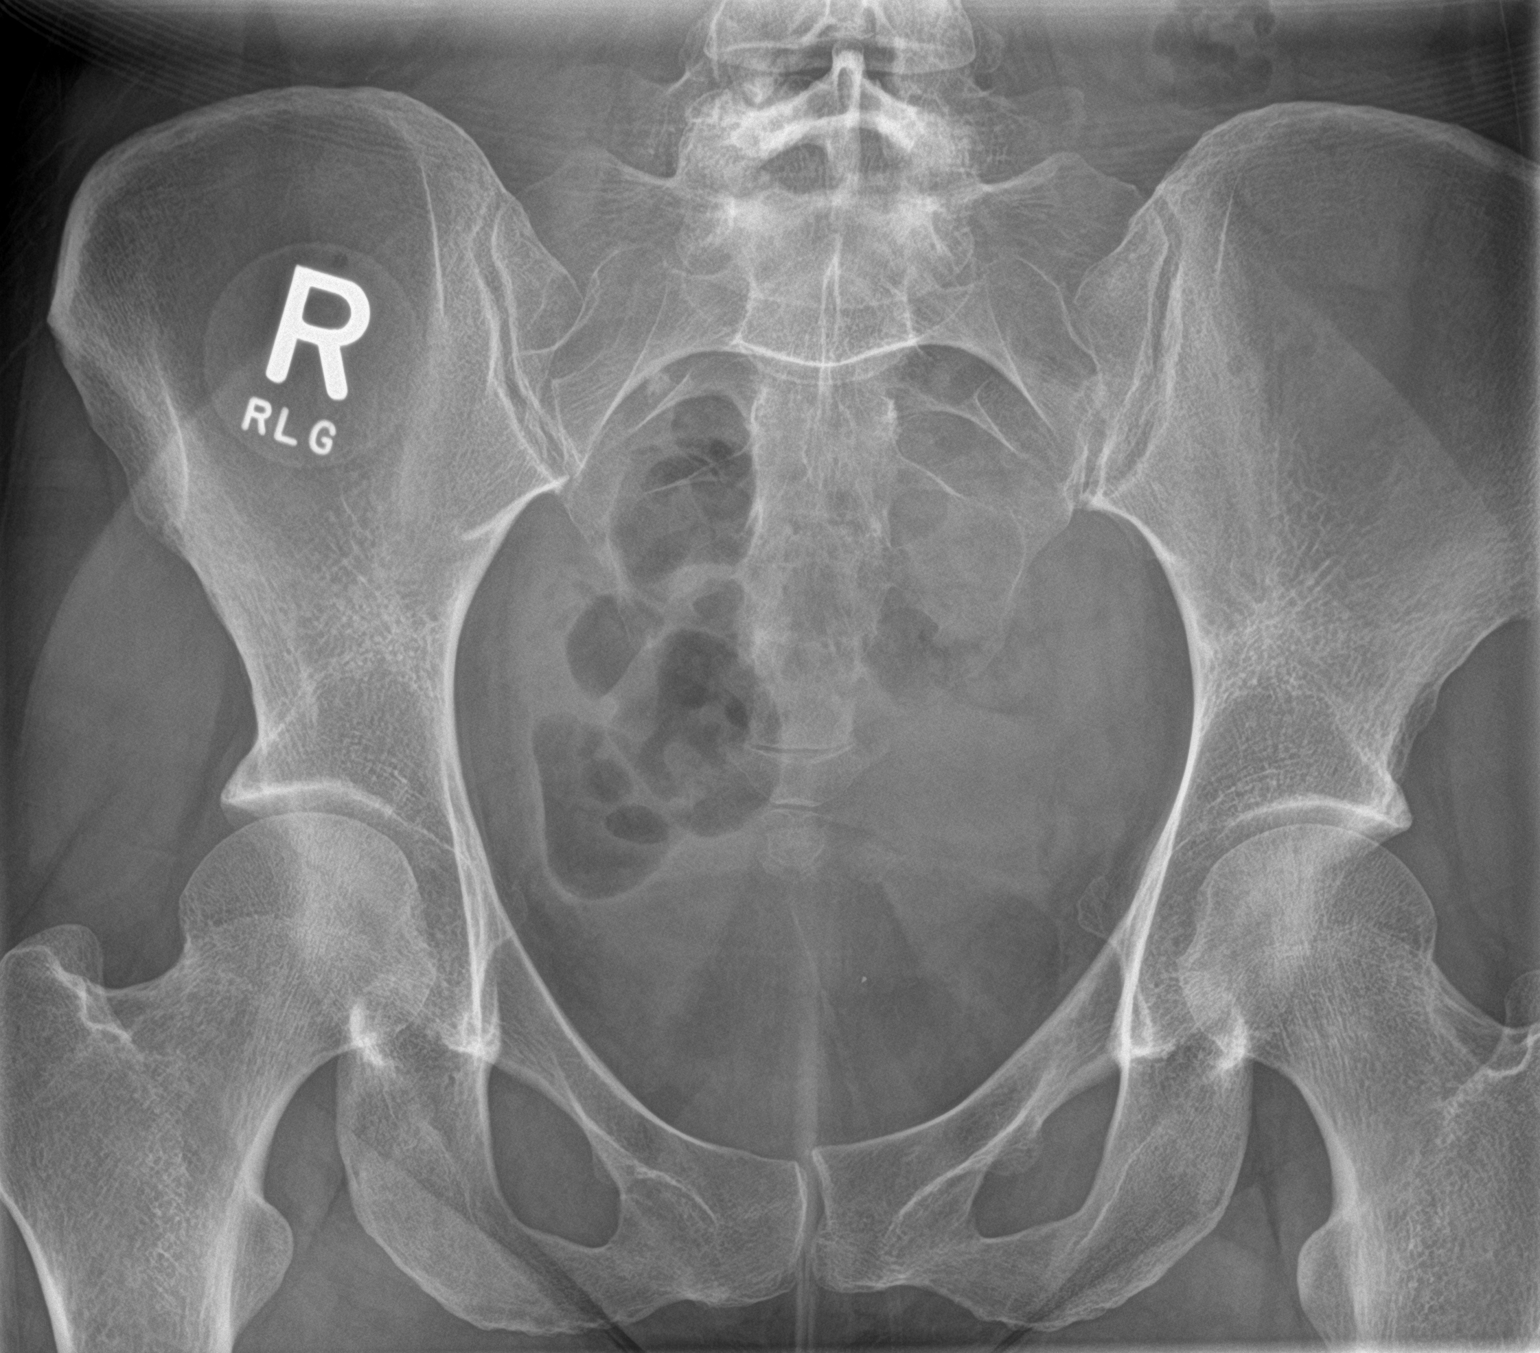

[2 of 2 positions shown; findings below may reference images not displayed]

FINDINGS: There is a punctate (approximately 0.5 cm) opacity overlying the
right sacral ala which could represent a right-sided ureteral stone.
Otherwise, no definitive abnormal opacities overlies expected
location of either renal fossa, the left ureter or the urinary
bladder.

Paucity of bowel gas without evidence of enteric obstruction.

Nondiagnostic evaluation for pneumoperitoneum secondary to supine
positioning and exclusion of the lower thorax. No pneumatosis or
portal venous gas.

No acute osseous abnormalities.
IMPRESSION: 1. Punctate (approximately 0.5 cm) opacity overlies the right sacral
ala and may represent a right-sided ureteral stone. Clinical
correlation is advised. Further evaluation with noncontrast CT scan
the abdomen pelvis could be performed as indicated.
2. Otherwise, no evidence of nephrolithiasis.
